# Patient Record
Sex: Male | Born: 2000
Health system: Southern US, Community
[De-identification: ages and names within clinical notes are randomized; demographics above are authoritative.]

## PROBLEM LIST (undated history)

## (undated) DIAGNOSIS — I10 Essential (primary) hypertension: Secondary | ICD-10-CM

## (undated) DIAGNOSIS — G35D Multiple sclerosis, unspecified: Secondary | ICD-10-CM

## (undated) DIAGNOSIS — G35 Multiple sclerosis: Secondary | ICD-10-CM

## (undated) DIAGNOSIS — L309 Dermatitis, unspecified: Secondary | ICD-10-CM

## (undated) HISTORY — PX: NO PAST SURGERIES: SHX2092

## (undated) HISTORY — DX: Multiple sclerosis: G35

## (undated) HISTORY — DX: Essential (primary) hypertension: I10

## (undated) HISTORY — DX: Multiple sclerosis, unspecified: G35.D

## (undated) HISTORY — DX: Dermatitis, unspecified: L30.9

---
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
## 2020-01-13 ENCOUNTER — Ambulatory Visit: Payer: BC Managed Care – PPO | Admitting: Family Medicine

## 2020-01-25 ENCOUNTER — Ambulatory Visit: Payer: BC Managed Care – PPO | Attending: Internal Medicine

## 2020-01-25 DIAGNOSIS — Z23 Encounter for immunization: Secondary | ICD-10-CM

## 2020-01-25 NOTE — Progress Notes (Signed)
   [id number]  Name:  Javonne Dorko.    MRN: [id number] DOB: 2001/04/14  01/25/2020  Mr. Ryant was observed post Covid-19 immunization for 15 minutes without incident. He was provided with Vaccine Information Sheet and instruction to access the V-Safe system.   Mr. Mikita was instructed to call 911 with any severe reactions post vaccine: [name] Difficulty breathing  . Swelling of face and throat  . A fast heartbeat  . A bad rash all over body  . Dizziness and weakness   Immunizations Administered    Name Date Dose VIS Date Route   Pfizer COVID-19 Vaccine 01/25/2020  8:12 AM 0.3 mL 09/25/2019 Intramuscular   Manufacturer: [employer], [employer]   Lot: [id number]   NDC: [address]-9

## 2020-03-24 ENCOUNTER — Ambulatory Visit: Payer: BC Managed Care – PPO | Admitting: Family Medicine

## 2020-04-12 ENCOUNTER — Ambulatory Visit (INDEPENDENT_AMBULATORY_CARE_PROVIDER_SITE_OTHER): Payer: BC Managed Care – PPO | Admitting: Family Medicine

## 2020-04-12 ENCOUNTER — Encounter: Payer: Self-pay | Admitting: Family Medicine

## 2020-04-12 ENCOUNTER — Other Ambulatory Visit: Payer: Self-pay

## 2020-04-12 VITALS — BP 110/72 | HR 68 | Temp 98.0°F | Ht 66.0 in | Wt 197.2 lb

## 2020-04-12 DIAGNOSIS — I10 Essential (primary) hypertension: Secondary | ICD-10-CM | POA: Diagnosis not present

## 2020-04-12 DIAGNOSIS — J309 Allergic rhinitis, unspecified: Secondary | ICD-10-CM

## 2020-04-12 NOTE — Patient Instructions (Signed)
It was very nice to see you today!  Keep up the good work!  No changes today.  Please only know when you need a refill on your lisinopril.  Come back in 1 year for your annual checkup with blood work.  Please come back to see me sooner if needed.  Take care, Dr Jimmey Ralph  Please try these tips to maintain a healthy lifestyle:   Eat at least 3 REAL meals and 1-2 snacks per day.  Aim for no more than 5 hours between eating.  If you eat breakfast, please do so within one hour of getting up.    Each meal should contain half fruits/vegetables, one quarter protein, and one quarter carbs (no bigger than a computer mouse)   Cut down on sweet beverages. This includes juice, soda, and sweet tea.     Drink at least 1 glass of water with each meal and aim for at least 8 glasses per day   Exercise at least 150 minutes every week.

## 2020-04-12 NOTE — Assessment & Plan Note (Signed)
Stable.  Continue Zyrtec 10 mg daily and Flonase daily.

## 2020-04-12 NOTE — Progress Notes (Signed)
   [address] Sean Fox. is a 19 y.o. male who presents today for an office visit.  He is a new patient establishing care today.  Assessment/Plan:  Chronic Problems Addressed Today: Allergic rhinitis Stable.  Continue Zyrtec 10 mg daily and Flonase daily.  Essential hypertension Stable and at goal. Continue lisinopril 5 mg daily.  Check CBC, C met, TSH next blood draw.     Subjective:  HPI:  His stable, chronic medical conditions are outlined below:  #Essential hypertension - On lisinopril 5 mg daily.  Tolerating well - ROS: No reported chest Fox or shortness of breath  #Allergic Rhinitis - On Zyrtec 10 mg daily and Flonase daily.  Tolerating both well        Objective:  Physical Exam: BP 110/72   Pulse 68   Temp 98 F (36.7 C)   Ht '5\' 6"'$  (1.676 m)   Wt 197 lb 3.2 oz (89.4 kg)   SpO2 98%   BMI 31.83 kg/m   Gen: No acute distress, resting comfortably CV: Regular rate and rhythm with no murmurs appreciated Pulm: Normal work of breathing, clear to auscultation bilaterally with no crackles, wheezes, or rhonchi Neuro: Grossly normal, moves all extremities Psych: Normal affect and thought content      Sean Makara M. Jerline Pain, MD 04/12/2020 3:12 PM

## 2020-04-12 NOTE — Assessment & Plan Note (Signed)
Stable and at goal. Continue lisinopril 5 mg daily.  Check CBC, C met, TSH next blood draw.

## 2020-09-15 ENCOUNTER — Encounter: Payer: Self-pay | Admitting: Family Medicine

## 2020-09-15 ENCOUNTER — Other Ambulatory Visit: Payer: Self-pay

## 2020-09-15 MED ORDER — LISINOPRIL 5 MG PO TABS: 5.0000 mg | ORAL_TABLET | Freq: Every day | ORAL | 1 refills | Status: DC

## 2020-11-22 ENCOUNTER — Emergency Department (HOSPITAL_COMMUNITY)
Admission: EM | Admit: 2020-11-22 | Discharge: 2020-11-22 | Disposition: A | Discharge status: Home / Self Care | Payer: BC Managed Care – PPO | Source: Home / Self Care | Attending: Emergency Medicine | Admitting: Emergency Medicine

## 2020-11-22 ENCOUNTER — Encounter (HOSPITAL_COMMUNITY): Payer: Self-pay

## 2020-11-22 ENCOUNTER — Other Ambulatory Visit: Payer: Self-pay

## 2020-11-22 DIAGNOSIS — Z79899 Other long term (current) drug therapy: Secondary | ICD-10-CM | POA: Insufficient documentation

## 2020-11-22 DIAGNOSIS — I10 Essential (primary) hypertension: Secondary | ICD-10-CM | POA: Insufficient documentation

## 2020-11-22 DIAGNOSIS — E559 Vitamin D deficiency, unspecified: Secondary | ICD-10-CM | POA: Diagnosis not present

## 2020-11-22 DIAGNOSIS — H9312 Tinnitus, left ear: Secondary | ICD-10-CM | POA: Insufficient documentation

## 2020-11-22 DIAGNOSIS — G35 Multiple sclerosis: Secondary | ICD-10-CM | POA: Diagnosis not present

## 2020-11-22 DIAGNOSIS — M5021 Other cervical disc displacement,  high cervical region: Secondary | ICD-10-CM | POA: Diagnosis not present

## 2020-11-22 DIAGNOSIS — J3489 Other specified disorders of nose and nasal sinuses: Secondary | ICD-10-CM | POA: Insufficient documentation

## 2020-11-22 DIAGNOSIS — R0981 Nasal congestion: Secondary | ICD-10-CM | POA: Insufficient documentation

## 2020-11-22 DIAGNOSIS — R202 Paresthesia of skin: Secondary | ICD-10-CM | POA: Insufficient documentation

## 2020-11-22 DIAGNOSIS — R42 Dizziness and giddiness: Secondary | ICD-10-CM | POA: Insufficient documentation

## 2020-11-22 DIAGNOSIS — R432 Parageusia: Secondary | ICD-10-CM | POA: Diagnosis not present

## 2020-11-22 DIAGNOSIS — Z8249 Family history of ischemic heart disease and other diseases of the circulatory system: Secondary | ICD-10-CM | POA: Diagnosis not present

## 2020-11-22 DIAGNOSIS — M50222 Other cervical disc displacement at C5-C6 level: Secondary | ICD-10-CM | POA: Diagnosis not present

## 2020-11-22 DIAGNOSIS — H5509 Other forms of nystagmus: Secondary | ICD-10-CM | POA: Diagnosis not present

## 2020-11-22 DIAGNOSIS — Q7649 Other congenital malformations of spine, not associated with scoliosis: Secondary | ICD-10-CM | POA: Diagnosis not present

## 2020-11-22 DIAGNOSIS — Z20822 Contact with and (suspected) exposure to covid-19: Secondary | ICD-10-CM | POA: Diagnosis not present

## 2020-11-22 DIAGNOSIS — H9203 Otalgia, bilateral: Secondary | ICD-10-CM | POA: Insufficient documentation

## 2020-11-22 DIAGNOSIS — G379 Demyelinating disease of central nervous system, unspecified: Secondary | ICD-10-CM | POA: Diagnosis not present

## 2020-11-22 DIAGNOSIS — J302 Other seasonal allergic rhinitis: Secondary | ICD-10-CM | POA: Diagnosis not present

## 2020-11-22 DIAGNOSIS — Z820 Family history of epilepsy and other diseases of the nervous system: Secondary | ICD-10-CM | POA: Diagnosis not present

## 2020-11-22 LAB — COMPREHENSIVE METABOLIC PANEL
ALT: 19 U/L (ref 0–44)
AST: 17 U/L (ref 15–41)
Albumin: 4.8 g/dL (ref 3.5–5.0)
Alkaline Phosphatase: 60 U/L (ref 38–126)
Anion gap: 8 (ref 5–15)
BUN: 14 mg/dL (ref 6–20)
CO2: 27 mmol/L (ref 22–32)
Calcium: 9.7 mg/dL (ref 8.9–10.3)
Chloride: 105 mmol/L (ref 98–111)
Creatinine, Ser: 0.78 mg/dL (ref 0.61–1.24)
GFR, Estimated: >60 mL/min (ref >60)
Glucose, Bld: 99 mg/dL (ref 70–99)
Potassium: 4.4 mmol/L (ref 3.5–5.1)
Sodium: 140 mmol/L (ref 135–145)
Total Bilirubin: 0.6 mg/dL (ref 0.3–1.2)
Total Protein: 8.1 g/dL (ref 6.5–8.1)

## 2020-11-22 LAB — CBC WITH DIFFERENTIAL/PLATELET
Abs Immature Granulocytes: 0.02 10*3/uL (ref 0.00–0.07)
Basophils Absolute: 0 10*3/uL (ref 0.0–0.1)
Basophils Relative: 0 %
Eosinophils Absolute: 0 10*3/uL (ref 0.0–0.5)
Eosinophils Relative: 0 %
HCT: 45.9 % (ref 39.0–52.0)
Hemoglobin: 15.4 g/dL (ref 13.0–17.0)
Immature Granulocytes: 0 %
Lymphocytes Relative: 18 %
Lymphs Abs: 1.1 10*3/uL (ref 0.7–4.0)
MCH: 31.7 pg (ref 26.0–34.0)
MCHC: 33.6 g/dL (ref 30.0–36.0)
MCV: 94.4 fL (ref 80.0–100.0)
Monocytes Absolute: 0.3 10*3/uL (ref 0.1–1.0)
Monocytes Relative: 5 %
Neutro Abs: 4.7 10*3/uL (ref 1.7–7.7)
Neutrophils Relative %: 77 %
Platelets: 263 10*3/uL (ref 150–400)
RBC: 4.86 MIL/uL (ref 4.22–5.81)
RDW: 12.3 % (ref 11.5–15.5)
WBC: 6.2 10*3/uL (ref 4.0–10.5)
nRBC: 0 % (ref 0.0–0.2)

## 2020-11-22 MED ORDER — MECLIZINE HCL 25 MG PO TABS
25.0000 mg | ORAL_TABLET | Freq: Once | ORAL | Status: AC
  Administered 2020-11-22: 25 mg via ORAL
  Filled 2020-11-22: qty 1

## 2020-11-22 MED ORDER — LORAZEPAM 2 MG/ML IJ SOLN
0.5000 mg | Freq: Once | INTRAMUSCULAR | Status: AC
  Administered 2020-11-22: 0.5 mg via INTRAVENOUS
  Filled 2020-11-22: qty 1

## 2020-11-22 MED ORDER — MECLIZINE HCL 25 MG PO TABS: 25.0000 mg | ORAL_TABLET | Freq: Three times a day (TID) | ORAL | 0 refills | Status: DC | PRN

## 2020-11-22 MED ORDER — SODIUM CHLORIDE 0.9 % IV BOLUS
1000.0000 mL | Freq: Once | INTRAVENOUS | Status: AC
  Administered 2020-11-22: 1000 mL via INTRAVENOUS

## 2020-11-22 NOTE — Discharge Instructions (Signed)
Follow the instructions regarding vertigo. Take the meclizine as needed to help with your symptoms. Is important for you to follow-up with the ENT provider listed below for further evaluation and management of your symptoms. Return to the ER if you start to experience headache, blurry vision, numbness in arms or legs, facial droop or shortness of breath.

## 2020-11-22 NOTE — ED Notes (Addendum)
Pt ambulated in hallway with stand by assist. Pt unsteady, pt tends to side step and unable to get balance. Pt stated he "felt dizzy" as he walked back to bedside. Pt returned to bed and vitals were obtained. Vitals in chart.

## 2020-11-22 NOTE — ED Provider Notes (Signed)
I provided a substantive portion of the care of this patient.  I personally performed the entirety of the medical decision making for this encounter.     20 year old male presents with dizziness and left-sided ear tinnitus.  On exam here patient able to ambulate.  No focal deficits.  No gait ataxia.  Given meclizine with slight improvement.  Will redose here.  Low suspicion for intracranial process.  Will likely discharge with ENT referral    Lorre Nick, MD 11/22/20 1406

## 2020-11-22 NOTE — ED Triage Notes (Signed)
Patient reports dizziness and tingling of the left side of his face and ringing in the left ear x 3 days.

## 2020-11-22 NOTE — ED Notes (Signed)
Pt in bed watching tv and talking with visitor. Respirations even and unlabored. Denies any needs at this time.

## 2020-11-22 NOTE — ED Provider Notes (Addendum)
[facility]-EMERGENCY DEPT Provider Note   CSN: [id number] Arrival date & time: 11/22/20  0909     History Chief Complaint  Patient presents with  . Dizziness    Sean Fox. is a 20 y.o. male with a past medical history of hypertension, allergic rhinitis presenting to the ED with a chief complaint of dizziness and tinnitus. For the past 3 days has been having dizziness that he describes as the room spinning around him when he looks at one spot or when he ambulates. Reports paresthesias to the left side of his face as well as ringing in his left ear. States that when he is sitting in silence, there is no tinnitus but as soon as there is any sound omitted from anything, he will experience the tinnitus in his left ear. States that the dizziness got worse today. Reports congestion and rhinorrhea that is not unusual for him around this time of the year due to seasonal allergies. Has been compliant with his medications including his antihistamine and antihypertensive. Denies any vision changes, headache, vomiting, nausea, numbness in arms or legs, facial asymmetry or shortness of breath.  HPI     Past Medical History:  Diagnosis Date  . Eczema   . Hypertension     Patient Active Problem List   Diagnosis Date Noted  . Essential hypertension 04/12/2020  . Allergic rhinitis 04/12/2020  . HyperCKemia 01/02/2019    Past Surgical History:  Procedure Laterality Date  . NO PAST SURGERIES         Family History  Problem Relation Age of Onset  . Hypertension Mother   . Hypertension Father   . Migraines Neg Hx   . Seizures Neg Hx   . Autism Neg Hx   . ADD / ADHD Neg Hx   . Anxiety disorder Neg Hx   . Depression Neg Hx   . Bipolar disorder Neg Hx   . Schizophrenia Neg Hx     Social History   Tobacco Use  . Smoking status: Never Smoker  . Smokeless tobacco: Never Used  Vaping Use  . Vaping Use: Never used  Substance Use Topics  . Alcohol use: Never  .  Drug use: Never    Home Medications Prior to Admission medications   Medication Sig Start Date End Date Taking? Authorizing Provider  acetaminophen (TYLENOL) 325 MG tablet Take 650 mg by mouth every 6 (six) hours as needed for mild pain, fever or headache.   Yes [provider]  cetirizine (ZYRTEC) 10 MG tablet Take 10 mg by mouth daily. 01/24/17  Yes [provider]  Cholecalciferol 25 MCG (1000 UT) tablet Take 2,000 Units by mouth daily.   Yes [provider]  lisinopril (ZESTRIL) 5 MG tablet Take 1 tablet (5 mg total) by mouth daily. 09/15/20  Yes Ardith Dark, MD  meclizine (ANTIVERT) 25 MG tablet Take 1 tablet (25 mg total) by mouth 3 (three) times daily as needed for dizziness. 11/22/20  Yes Tameshia Bonneville, PA-C  Multiple Vitamin (MULTIVITAMIN WITH MINERALS) TABS tablet Take 1 tablet by mouth daily.   Yes [provider]  SUPER B COMPLEX/C PO Take 1 tablet by mouth daily.   Yes [provider]    Allergies    Gramineae pollens and Mold extract [trichophyton]  Review of Systems   Review of Systems  Constitutional: Negative for appetite change, chills and fever.  HENT: Positive for tinnitus. Negative for ear pain, rhinorrhea, sneezing and sore throat.  Eyes: Negative for photophobia and visual disturbance.  Respiratory: Negative for cough, chest tightness, shortness of breath and wheezing.   Cardiovascular: Negative for chest pain and palpitations.  Gastrointestinal: Negative for abdominal pain, blood in stool, constipation, diarrhea, nausea and vomiting.  Genitourinary: Negative for dysuria, hematuria and urgency.  Musculoskeletal: Negative for myalgias.  Skin: Negative for rash.  Neurological: Positive for dizziness. Negative for tremors, syncope, facial asymmetry, speech difficulty, weakness, light-headedness, numbness and headaches.    Physical Exam Updated Vital Signs BP (!) 141/69   Pulse 75   Temp 98.2 F (36.8 C) (Oral)    Resp 18   Ht 5\' 6"  (1.676 m)   Wt 75.8 kg   SpO2 100%   BMI 26.95 kg/m   Physical Exam Vitals and nursing note reviewed.  Constitutional:      General: He is not in acute distress.    Appearance: He is well-developed and well-nourished.  HENT:     Head: Normocephalic and atraumatic.     Right Ear: A middle ear effusion is present. Tympanic membrane is not perforated or erythematous.     Left Ear: A middle ear effusion is present. Tympanic membrane is not perforated or erythematous.     Nose: Nose normal.  Eyes:     General: No scleral icterus.       Right eye: No discharge.        Left eye: No discharge.     Extraocular Movements: EOM normal.     Conjunctiva/sclera: Conjunctivae normal.     Pupils: Pupils are equal, round, and reactive to light.  Cardiovascular:     Rate and Rhythm: Normal rate and regular rhythm.     Pulses: Intact distal pulses.     Heart sounds: Normal heart sounds. No murmur heard. No friction rub. No gallop.   Pulmonary:     Effort: Pulmonary effort is normal. No respiratory distress.     Breath sounds: Normal breath sounds.  Abdominal:     General: Bowel sounds are normal. There is no distension.     Palpations: Abdomen is soft.     Tenderness: There is no abdominal tenderness. There is no guarding.  Musculoskeletal:        General: No edema. Normal range of motion.     Cervical back: Normal range of motion and neck supple.  Skin:    General: Skin is warm and dry.     Findings: No rash.  Neurological:     Mental Status: He is alert and oriented to person, place, and time.     Cranial Nerves: No cranial nerve deficit.     Sensory: No sensory deficit.     Motor: No weakness or abnormal muscle tone.     Coordination: Coordination normal.     Comments: Pupils reactive. No facial asymmetry noted. Cranial nerves appear grossly intact. Sensation intact to light touch on face, BUE and BLE. Strength 5/5 in BUE and BLE. Normal finger-to-nose coordination  bilaterally. Horizontal nystagmus noted bilaterally.  Psychiatric:        Mood and Affect: Mood and affect normal.     ED Results / Procedures / Treatments   Labs (all labs ordered are listed, but only abnormal results are displayed) Labs Reviewed  COMPREHENSIVE METABOLIC PANEL  CBC WITH DIFFERENTIAL/PLATELET    EKG EKG Interpretation  Date/Time:  Tuesday November 22 2020 14:01:45 EST Ventricular Rate:  72 PR Interval:    QRS Duration: 95 QT Interval:  397 QTC Calculation: 435 R  Axis:   93 Text Interpretation: Sinus arrhythmia Borderline right axis deviation No old tracing to compare Confirmed by Lorre Nick ([id number]) on 11/22/2020 2:56:49 PM   Radiology No results found.  Procedures Procedures   Medications Ordered in ED Medications  sodium chloride 0.9 % bolus 1,000 mL (0 mLs Intravenous Stopped 11/22/20 1427)  meclizine (ANTIVERT) tablet 25 mg (25 mg Oral Given 11/22/20 1202)  meclizine (ANTIVERT) tablet 25 mg (25 mg Oral Given 11/22/20 1425)  LORazepam (ATIVAN) injection 0.5 mg (0.5 mg Intravenous Given 11/22/20 1425)    ED Course  I have reviewed the triage vital signs and the nursing notes.  Pertinent labs & imaging results that were available during my care of the patient were reviewed by me and considered in my medical decision making (see chart for details).  Clinical Course as of 11/22/20 1500  Tue Nov 22, 2020  1143 Sodium: 140 [HK]  1143 Potassium: 4.4 [HK]  1351 Patient continues to have dizziness and feeling off balance despite IV fluids and meclizine.  Will add on additional dose of meclizine as well as Ativan and obtain EKG. [HK]    Clinical Course User Index [HK] Dietrich Pates, PA-C   MDM Rules/Calculators/A&P                          [phone] M with a past medical history of HTN, allergic rhinitis, presenting to the ED with a chief complaint of dizziness and tinnitus.  3-day history of dizziness and feeling like the room is spinning around him.  Reports  intermittent paresthesias to the left side of his face as well as ringing in his left ear when there is any type of sound there. This improves with silence.  He is unable to identify any trigger 3 days ago that may have caused this.  No headache, vision changes, numbness in arms or legs, facial droop, fever or recent illness.  On exam patient without any numbness or weakness of upper and lower extremities.  Sensation intact on face but he reports a "different feeling" with light touch to the left side of his face.  No facial asymmetry. He has bilateral horizontal nystagmus noted.  Normal coordination.  Lab work here including CBC, CMP are unremarkable.  Patient given IV fluids and 25 mg of meclizine here.  Unfortunately continues to be symptomatic with ambulation, but does report some improvement. However he is not ataxic. Question BPPV vs other dysfunction of the inner ear. Doubt emergent neurological cause such as stroke or dissection based on his age, comorbidities and physical exam findings will reassess after additional meclizine and Ativan.  2:58 PM Patient with continued improvement after additional medications.  He remains able to ambulate.  I feel that he will benefit from ENT follow-up for vertigo.  Patient mother comfortable with discharge home with meclizine as needed.  Will give instructions for exercises at home.  Return precautions given. Patient discussed with and seen by the attending, Dr. Freida Busman.  Patient is hemodynamically stable, in NAD, and able to ambulate in the ED. Evaluation does not show pathology that would require ongoing emergent intervention or inpatient treatment. I explained the diagnosis to the patient. Pain has been managed and has no complaints prior to discharge. Patient is comfortable with above plan and is stable for discharge at this time. All questions were answered prior to disposition. Strict return precautions for returning to the ED were discussed. Encouraged follow  up with PCP.  An After Visit Summary was printed and given to the patient.   Portions of this note were generated with Scientist, clinical (histocompatibility and immunogenetics). Dictation errors may occur despite best attempts at proofreading.  Final Clinical Impression(s) / ED Diagnoses Final diagnoses:  Vertigo    Rx / DC Orders ED Discharge Orders         Ordered    meclizine (ANTIVERT) 25 MG tablet  3 times daily PRN        11/22/20 1500            Dietrich Pates, PA-C 11/22/20 1502    Lorre Nick, MD 11/23/20 1342

## 2020-11-23 ENCOUNTER — Emergency Department (HOSPITAL_COMMUNITY): Payer: BC Managed Care – PPO

## 2020-11-23 ENCOUNTER — Encounter: Payer: Self-pay | Admitting: Family Medicine

## 2020-11-23 ENCOUNTER — Ambulatory Visit: Payer: BC Managed Care – PPO | Admitting: Family Medicine

## 2020-11-23 ENCOUNTER — Inpatient Hospital Stay (HOSPITAL_COMMUNITY)
Admission: RE | Admit: 2020-11-23 | Discharge: 2020-11-23 | Disposition: A | Discharge status: Home / Self Care | Payer: BC Managed Care – PPO | Source: Ambulatory Visit | Attending: Family Medicine | Admitting: Family Medicine

## 2020-11-23 ENCOUNTER — Other Ambulatory Visit: Payer: Self-pay

## 2020-11-23 ENCOUNTER — Encounter (HOSPITAL_COMMUNITY): Payer: Self-pay

## 2020-11-23 ENCOUNTER — Inpatient Hospital Stay (HOSPITAL_COMMUNITY)
Admission: EM | Admit: 2020-11-23 | Discharge: 2020-11-27 | DRG: 060 | Disposition: A | Discharge status: Home / Self Care | Payer: BC Managed Care – PPO | Source: Ambulatory Visit | Attending: Internal Medicine | Admitting: Internal Medicine

## 2020-11-23 VITALS — BP 127/76 | HR 81 | Temp 97.6°F | Ht 66.0 in | Wt 174.4 lb

## 2020-11-23 DIAGNOSIS — R42 Dizziness and giddiness: Secondary | ICD-10-CM

## 2020-11-23 DIAGNOSIS — G35A Relapsing-remitting multiple sclerosis: Secondary | ICD-10-CM | POA: Diagnosis present

## 2020-11-23 DIAGNOSIS — J302 Other seasonal allergic rhinitis: Secondary | ICD-10-CM | POA: Diagnosis present

## 2020-11-23 DIAGNOSIS — I10 Essential (primary) hypertension: Secondary | ICD-10-CM | POA: Diagnosis present

## 2020-11-23 DIAGNOSIS — Z79899 Other long term (current) drug therapy: Secondary | ICD-10-CM | POA: Diagnosis not present

## 2020-11-23 DIAGNOSIS — R432 Parageusia: Secondary | ICD-10-CM

## 2020-11-23 DIAGNOSIS — Z820 Family history of epilepsy and other diseases of the nervous system: Secondary | ICD-10-CM

## 2020-11-23 DIAGNOSIS — H5509 Other forms of nystagmus: Secondary | ICD-10-CM

## 2020-11-23 DIAGNOSIS — E559 Vitamin D deficiency, unspecified: Secondary | ICD-10-CM | POA: Diagnosis present

## 2020-11-23 DIAGNOSIS — Z20822 Contact with and (suspected) exposure to covid-19: Secondary | ICD-10-CM | POA: Diagnosis present

## 2020-11-23 DIAGNOSIS — R202 Paresthesia of skin: Secondary | ICD-10-CM | POA: Insufficient documentation

## 2020-11-23 DIAGNOSIS — G35D Multiple sclerosis, unspecified: Secondary | ICD-10-CM | POA: Diagnosis present

## 2020-11-23 DIAGNOSIS — G35 Multiple sclerosis: Secondary | ICD-10-CM | POA: Diagnosis present

## 2020-11-23 DIAGNOSIS — Z8249 Family history of ischemic heart disease and other diseases of the circulatory system: Secondary | ICD-10-CM | POA: Diagnosis not present

## 2020-11-23 LAB — RESP PANEL BY RT-PCR (FLU A&B, COVID) ARPGX2
Influenza A by PCR: NEGATIVE
Influenza B by PCR: NEGATIVE
SARS Coronavirus 2 by RT PCR: NEGATIVE

## 2020-11-23 MED ORDER — ACETAMINOPHEN 325 MG PO TABS: 650.0000 mg | ORAL_TABLET | Freq: Four times a day (QID) | ORAL | Status: DC | PRN

## 2020-11-23 MED ORDER — ONDANSETRON HCL 4 MG PO TABS: 4.0000 mg | ORAL_TABLET | Freq: Four times a day (QID) | ORAL | Status: DC | PRN

## 2020-11-23 MED ORDER — SODIUM CHLORIDE 0.9 % IV SOLN
1000.0000 mg | Freq: Every day | INTRAVENOUS | Status: AC
  Administered 2020-11-24 – 2020-11-27 (×5): 1000 mg via INTRAVENOUS
  Filled 2020-11-23 (×5): qty 8

## 2020-11-23 MED ORDER — GADOBUTROL 1 MMOL/ML IV SOLN
8.0000 mL | Freq: Once | INTRAVENOUS | Status: AC | PRN
  Administered 2020-11-23: 8 mL via INTRAVENOUS

## 2020-11-23 MED ORDER — ONDANSETRON HCL 4 MG/2ML IJ SOLN: 4.0000 mg | Freq: Four times a day (QID) | INTRAMUSCULAR | Status: DC | PRN

## 2020-11-23 MED ORDER — LISINOPRIL 10 MG PO TABS
5.0000 mg | ORAL_TABLET | Freq: Every day | ORAL | Status: DC
  Administered 2020-11-24 – 2020-11-25 (×2): 5 mg via ORAL
  Filled 2020-11-23 (×2): qty 1

## 2020-11-23 MED ORDER — ONDANSETRON 8 MG PO TBDP: 8.0000 mg | ORAL_TABLET | Freq: Three times a day (TID) | ORAL | 0 refills | Status: DC | PRN

## 2020-11-23 MED ORDER — LACTATED RINGERS IV SOLN: INTRAVENOUS | Status: DC

## 2020-11-23 MED ORDER — MECLIZINE HCL 12.5 MG PO TABS
25.0000 mg | ORAL_TABLET | Freq: Three times a day (TID) | ORAL | Status: DC | PRN
  Administered 2020-11-25 – 2020-11-26 (×2): 25 mg via ORAL
  Filled 2020-11-23 (×2): qty 2

## 2020-11-23 NOTE — ED Provider Notes (Signed)
[facility] EMERGENCY DEPARTMENT Provider Note  CSN: [id number] Arrival date & time: 11/23/20 1459    History Chief Complaint  Patient presents with  . Dizziness    HPI  Sean Fox. is a 20 y.o. male with history of HTN has had several days of room spinning dizziness, L face paresthesias and ringing in L ear. No numbness or weakness in extremities. Seen at [facility] for same yesterday and felt to have peripheral vertigo, discharged with Rx for Meclizine. CBC and CMP were normal. He went to his PCP today in follow up and was noted to have continued horizontal nystagmus so sent for an outpatient MRI which showed lesions concerning for MS. Mother at bedside reports strong family history of MS as well.    Past Medical History:  Diagnosis Date  . Eczema   . Hypertension     Past Surgical History:  Procedure Laterality Date  . NO PAST SURGERIES      Family History  Problem Relation Age of Onset  . Hypertension Mother   . Hypertension Father   . Migraines Neg Hx   . Seizures Neg Hx   . Autism Neg Hx   . ADD / ADHD Neg Hx   . Anxiety disorder Neg Hx   . Depression Neg Hx   . Bipolar disorder Neg Hx   . Schizophrenia Neg Hx     Social History   Tobacco Use  . Smoking status: Never Smoker  . Smokeless tobacco: Never Used  Vaping Use  . Vaping Use: Never used  Substance Use Topics  . Alcohol use: Never  . Drug use: Never     Home Medications Prior to Admission medications   Medication Sig Start Date End Date Taking? Authorizing Provider  acetaminophen (TYLENOL) 325 MG tablet Take 650 mg by mouth every 6 (six) hours as needed for mild pain, fever or headache.    [provider]  cetirizine (ZYRTEC) 10 MG tablet Take 10 mg by mouth daily. 01/24/17   [provider]  Cholecalciferol 25 MCG (1000 UT) tablet Take 2,000 Units by mouth daily.    [provider]  lisinopril (ZESTRIL) 5 MG tablet Take 1 tablet (5 mg total) by mouth daily. 09/15/20    Ardith Dark, MD  meclizine (ANTIVERT) 25 MG tablet Take 1 tablet (25 mg total) by mouth 3 (three) times daily as needed for dizziness. 11/22/20   Khatri, Hina, PA-C  Multiple Vitamin (MULTIVITAMIN WITH MINERALS) TABS tablet Take 1 tablet by mouth daily.    [provider]  ondansetron (ZOFRAN ODT) 8 MG disintegrating tablet Take 1 tablet (8 mg total) by mouth every 8 (eight) hours as needed for nausea or vomiting. 11/23/20   Ardith Dark, MD  SUPER B COMPLEX/C PO Take 1 tablet by mouth daily.    [provider]     Allergies    Gramineae pollens and Mold extract [trichophyton]   Review of Systems   Review of Systems A comprehensive review of systems was completed and negative except as noted in HPI.    Physical Exam BP (!) 151/77   Pulse 72   Temp 99.2 F (37.3 C)   Resp 18   SpO2 98%   Physical Exam Vitals and nursing note reviewed.  Constitutional:      Appearance: Normal appearance.  HENT:     Head: Normocephalic and atraumatic.     Nose: Nose normal.     Mouth/Throat:     Mouth:  Mucous membranes are moist.  Eyes:     Extraocular Movements: Extraocular movements intact.     Conjunctiva/sclera: Conjunctivae normal.  Cardiovascular:     Rate and Rhythm: Normal rate.  Pulmonary:     Effort: Pulmonary effort is normal.     Breath sounds: Normal breath sounds.  Abdominal:     General: Abdomen is flat.     Palpations: Abdomen is soft.     Tenderness: There is no abdominal tenderness.  Musculoskeletal:        General: No swelling. Normal range of motion.     Cervical back: Neck supple.  Skin:    General: Skin is warm and dry.  Neurological:     General: No focal deficit present.     Mental Status: He is alert.     Comments: Subjective change in sensation of L face, horizontal nystagmus, normal strength and sensation in extremities.   Psychiatric:        Mood and Affect: Mood normal.      ED Results / Procedures / Treatments   Labs (all  labs ordered are listed, but only abnormal results are displayed) Labs Reviewed - No data to display  EKG None  Radiology MR Brain Wo Contrast  Result Date: 11/23/2020 CLINICAL DATA:  Horizontal nystagmus, vertigo, paresthesia, dysgeusia. EXAM: MRI HEAD WITHOUT CONTRAST TECHNIQUE: Multiplanar, multiecho pulse sequences of the brain and surrounding structures were obtained without intravenous contrast. COMPARISON:  None. FINDINGS: Brain: No acute infarction, hemorrhage, hydrocephalus, extra-axial collection or mass lesion. Scattered foci of T2 hyperintensity are seen within the white matter of the cerebral hemispheres, including deep, juxta cortical, periventricular white matter and corpus callosum, as well as left cerebral peduncle, left side of the pons and bilateral middle cerebellar peduncles and cerebellar hemispheres. Vascular: Normal flow voids. Skull and upper cervical spine: Normal marrow signal. Sinuses/Orbits: Negative. IMPRESSION: Scattered foci of T2 hyperintensity within the white matter of the cerebral hemispheres, including deep, juxta cortical, periventricular white matter and corpus callosum, as well as left cerebral peduncle, left side of the pons and bilateral middle cerebellar peduncles and cerebellar hemispheres. While nonspecific, findings are highly concerning for demyelinating disease. Electronically Signed   By: Baldemar Lenis M.D.   On: 11/23/2020 13:15    Procedures Procedures  Medications Ordered in the ED Medications - No data to display   MDM Rules/Calculators/A&P MDM MRI images reviewed, will discuss with Neurology.   ED Course  I have reviewed the triage vital signs and the nursing notes.  Pertinent labs & imaging results that were available during my care of the patient were reviewed by me and considered in my medical decision making (see chart for details).  Clinical Course as of 11/23/20 2140  Wed Nov 23, 2020  1916 Spoke with Dr. Wilford Corner,  Neurology who has reviewed the patient's outpatient MRI and requests he have another MRI with contrast this time to evaluate whether there are active lesions. Patient and mother aware of plan.  [CS]  2129 Reviewed MRI images with Dr. Wilford Corner, who recommends the patient be admitted for high dose steroids which he will order. Hospitalist paged to admit.  [CS]  2139 Spoke with Dr. Rachael Darby, Hospitalist, who will evaluate for admission.  [CS]    Clinical Course User Index [CS] Pollyann Savoy, MD    Final Clinical Impression(s) / ED Diagnoses Final diagnoses:  None    Rx / DC Orders ED Discharge Orders    None  Pollyann Savoy, MD 11/23/20 Windell Moment

## 2020-11-23 NOTE — Patient Instructions (Signed)
It was very nice to see you today!  You may be having an inner ear issue.  We need to get a scan of your brain to make sure there is nothing else going on.  Please take the Zofran as needed to help with your nausea.  Please try to stay as well hydrated as possible.  We will contact you with the results when your scan once it is back.   Take care, Dr Jimmey Ralph  Please try these tips to maintain a healthy lifestyle:   Eat at least 3 REAL meals and 1-2 snacks per day.  Aim for no more than 5 hours between eating.  If you eat breakfast, please do so within one hour of getting up.    Each meal should contain half fruits/vegetables, one quarter protein, and one quarter carbs (no bigger than a computer mouse)   Cut down on sweet beverages. This includes juice, soda, and sweet tea.     Drink at least 1 glass of water with each meal and aim for at least 8 glasses per day   Exercise at least 150 minutes every week.

## 2020-11-23 NOTE — ED Notes (Signed)
Patient in MRI 

## 2020-11-23 NOTE — H&P (Signed)
History and Physical    [employer] KWI:[id number] DOB: December 01, 2000 DOA: 11/23/2020  PCP: Ardith Dark, MD   Patient coming from: Home  Chief Complaint: Dizziness, intermittent blurry vision.   HPI: Sean Fox. is a 20 y.o. male with medical history significant for HTN has had several days of dizziness which he describes as the room spinning. He also reports left face paresthesias and ringing in left ear. No numbness or weakness in extremities. Seen at [facility] for same symptoms yesterday and felt to have peripheral vertigo and he was discharged with Rx for Meclizine. CBC and CMP were normal. He went to his PCP today in follow up and was noted to have continued horizontal nystagmus so he was sent for an outpatient MRI which showed lesions concerning for MS. He denies any current weakness but says that he feels some tingling and paresthesias on the left face.  When asked about any prior episodes of visual symptoms, he says that he cannot remember exactly when it happened but has had previous visual changes that that lasted a few days to a week before they got better. He does not remember whether it was painful or painless. Mother was at bedside and reported that she has multiple family members including brother, niece and grandparents who had MS and one of the family members-the mother's brother, passed away due to complications of MS at a very young age in his 51s. Does not describe worsening of symptoms with heat.  Does not describe any electric shocklike sensation down the spine.   Review of Systems:  General: Denies fever, chills, weight loss, night sweats. Reports dizziness.  Denies change in appetite HENT: Denies head trauma, headache, denies change in hearing, tinnitus.  Denies nasal congestion or bleeding.  Denies sore throat, sores in mouth.  Denies difficulty swallowing Eyes: Reports intermittent blurry vision. Denies pain in eye, drainage.  Denies discoloration of eyes. Neck: Denies  pain.  Denies swelling.  Denies pain with movement. Cardiovascular: Denies chest pain, palpitations.  Denies edema.  Denies orthopnea Respiratory: Denies shortness of breath, cough.  Denies wheezing.  Denies sputum production Gastrointestinal: Denies abdominal pain, swelling.  Denies nausea, vomiting, diarrhea.  Denies melena.  Denies hematemesis. Musculoskeletal: Denies limitation of movement.  Denies deformity or swelling.  Denies pain.  Denies arthralgias or myalgias. Genitourinary: Denies pelvic pain.  Denies urinary frequency or hesitancy.  Denies dysuria.  Skin: Denies rash.  Denies petechiae, purpura, ecchymosis. Neurological: Denies headache.  Denies syncope.  Denies seizure activity. Denies weakness. Denies slurred speech, drooping face. Reports vertigo Psychiatric: Denies depression, anxiety. Denies hallucinations.  Past Medical History:  Diagnosis Date  . Eczema   . Hypertension     Past Surgical History:  Procedure Laterality Date  . NO PAST SURGERIES      Social History  reports that he has never smoked. He has never used smokeless tobacco. He reports that he does not drink alcohol and does not use drugs.  Allergies  Allergen Reactions  . Gramineae Pollens   . Mold Extract [Trichophyton]     Family History  Problem Relation Age of Onset  . Hypertension Mother   . Hypertension Father   . Migraines Neg Hx   . Seizures Neg Hx   . Autism Neg Hx   . ADD / ADHD Neg Hx   . Anxiety disorder Neg Hx   . Depression Neg Hx   . Bipolar disorder Neg Hx   . Schizophrenia Neg Hx  Prior to Admission medications   Medication Sig Start Date End Date Taking? Authorizing Provider  acetaminophen (TYLENOL) 325 MG tablet Take 650 mg by mouth every 6 (six) hours as needed for mild pain, fever or headache.    [provider]  cetirizine (ZYRTEC) 10 MG tablet Take 10 mg by mouth daily. 01/24/17   [provider]  Cholecalciferol 25 MCG (1000 UT) tablet Take  2,000 Units by mouth daily.    [provider]  lisinopril (ZESTRIL) 5 MG tablet Take 1 tablet (5 mg total) by mouth daily. 09/15/20   Ardith DarkParker, Caleb M, MD  meclizine (ANTIVERT) 25 MG tablet Take 1 tablet (25 mg total) by mouth 3 (three) times daily as needed for dizziness. 11/22/20   Khatri, Hina, PA-C  Multiple Vitamin (MULTIVITAMIN WITH MINERALS) TABS tablet Take 1 tablet by mouth daily.    [provider]  ondansetron (ZOFRAN ODT) 8 MG disintegrating tablet Take 1 tablet (8 mg total) by mouth every 8 (eight) hours as needed for nausea or vomiting. 11/23/20   Ardith DarkParker, Caleb M, MD  SUPER B COMPLEX/C PO Take 1 tablet by mouth daily.    [provider]    Physical Exam: Vitals:   11/23/20 1530 11/23/20 1843 11/23/20 1930 11/23/20 2204  BP: 133/67 (!) 151/77 140/66 124/79  Pulse: 94 72 74 79  Resp: 16 18 14 16   Temp: 98.5 F (36.9 C) 99.2 F (37.3 C) 98.5 F (36.9 C)   TempSrc:   Oral   SpO2: 100% 98% 100% 100%    Constitutional: NAD, calm, comfortable Vitals:   11/23/20 1530 11/23/20 1843 11/23/20 1930 11/23/20 2204  BP: 133/67 (!) 151/77 140/66 124/79  Pulse: 94 72 74 79  Resp: 16 18 14 16   Temp: 98.5 F (36.9 C) 99.2 F (37.3 C) 98.5 F (36.9 C)   TempSrc:   Oral   SpO2: 100% 98% 100% 100%   General: WDWN, Alert and oriented x3.  Eyes: EOMI, PERRL, conjunctivae normal.  Sclera nonicteric. Has nystagmus fast component to the right on left and right gaze HENT:  [place]/AT, external ears normal.  Nares patent without epistasis.  Mucous membranes are moist. Posterior pharynx clear of any exudate or lesions. Normal dentition.  Neck: Soft, normal range of motion, supple, no masses, no thyromegaly. Trachea midline Respiratory: clear to auscultation bilaterally, no wheezing, no crackles. Normal respiratory effort. No accessory muscle use.  Cardiovascular: Regular rate and rhythm, no murmurs / rubs / gallops. No extremity edema. 2+ pedal pulses.  Abdomen: Soft, no  tenderness, nondistended, no rebound or guarding.  No masses palpated. Bowel sounds normoactive Musculoskeletal: FROM. no cyanosis. No joint deformity upper and lower extremities. Normal muscle tone.  Skin: Warm, dry, intact no rashes, lesions, ulcers. No induration Neurologic:  Has diminished sensation on left face and forehead to light touch. Normal speech.  patella DTR +1 bilaterally. Strength 5/5 in all extremities.   Psychiatric: Normal judgment and insight.  Normal mood.    Labs on Admission: I have personally reviewed following labs and imaging studies  CBC: Recent Labs  Lab 11/22/20 1101  WBC 6.2  NEUTROABS 4.7  HGB 15.4  HCT 45.9  MCV 94.4  PLT 263    Basic Metabolic Panel: Recent Labs  Lab 11/22/20 1101  NA 140  K 4.4  CL 105  CO2 27  GLUCOSE 99  BUN 14  CREATININE 0.78  CALCIUM 9.7    GFR: Estimated Creatinine Clearance: 146.8 mL/min (by C-G formula based on  SCr of 0.78 mg/dL).  Liver Function Tests: Recent Labs  Lab 11/22/20 1101  AST 17  ALT 19  ALKPHOS 60  BILITOT 0.6  PROT 8.1  ALBUMIN 4.8    Urine analysis: No results found for: COLORURINE, APPEARANCEUR, LABSPEC, PHURINE, GLUCOSEU, HGBUR, BILIRUBINUR, KETONESUR, PROTEINUR, UROBILINOGEN, NITRITE, LEUKOCYTESUR  Radiological Exams on Admission: MR Brain Wo Contrast  Result Date: 11/23/2020 CLINICAL DATA:  Horizontal nystagmus, vertigo, paresthesia, dysgeusia. EXAM: MRI HEAD WITHOUT CONTRAST TECHNIQUE: Multiplanar, multiecho pulse sequences of the brain and surrounding structures were obtained without intravenous contrast. COMPARISON:  None. FINDINGS: Brain: No acute infarction, hemorrhage, hydrocephalus, extra-axial collection or mass lesion. Scattered foci of T2 hyperintensity are seen within the white matter of the cerebral hemispheres, including deep, juxta cortical, periventricular white matter and corpus callosum, as well as left cerebral peduncle, left side of the pons and bilateral middle  cerebellar peduncles and cerebellar hemispheres. Vascular: Normal flow voids. Skull and upper cervical spine: Normal marrow signal. Sinuses/Orbits: Negative. IMPRESSION: Scattered foci of T2 hyperintensity within the white matter of the cerebral hemispheres, including deep, juxta cortical, periventricular white matter and corpus callosum, as well as left cerebral peduncle, left side of the pons and bilateral middle cerebellar peduncles and cerebellar hemispheres. While nonspecific, findings are highly concerning for demyelinating disease. Electronically Signed   By: Baldemar Lenis M.D.   On: 11/23/2020 13:15   MR BRAIN W CONTRAST  Result Date: 11/23/2020 CLINICAL DATA:  Dizziness. No contrast MRI suggestive of demyelinating disease. EXAM: MRI HEAD WITH CONTRAST TECHNIQUE: Multiplanar, multiecho pulse sequences of the brain and surrounding structures were obtained with intravenous contrast. CONTRAST:  32mL GADAVIST GADOBUTROL 1 MMOL/ML IV SOLN COMPARISON:  MRI of the brain November 23, 2018 at 12:41 p.m. FINDINGS: 3D FLAIR images again demonstrate multiple supratentorial and infratentorial hyperintense lesions concerning for demyelinating plaques. Postcontrast images show contrast enhancement associated with a large plaque within the left middle cerebellar peduncle, consistent with active demyelination. No other focal is of abnormal contrast enhancement. IMPRESSION: Contrast enhancement associated with a large plaque within the left middle cerebellar peduncle, consistent with active demyelination. Electronically Signed   By: Baldemar Lenis M.D.   On: 11/23/2020 21:27    Assessment/Plan Principal Problem:   Multiple sclerosis  Mr. Longest has abnormal MRI of the brain with demyelinating region consistent with sclerosis.  He has been seen by neurology and will have a MRI of the C-spine with and without contrast tomorrow as this will need to be done more than 12 hours after the scan  with contrast today.  He has family history of multiple sclerosis as well as autoimmune disease and neurology is ordered B12 level, ACE level, lupus panel, vitamin D, anti-[place] and anti-MOG antibody tests.  We will follow up with Dr. Epimenio Foot as an outpatient for neurology follow-up.  Neurology will follow in the hospital. He is being treated with high-dose Solu-Medrol daily for the next 5 days  Active Problems:   Essential hypertension Continue home lisinopril dose.  Monitor blood pressure    Vertigo Most likely secondary to the MS.  Consult physical therapy for evaluation in the morning and training of vestibular exercises    DVT prophylaxis: Early ambulation. Low Padua score Code Status:   Full Code  Family Communication:  Notes and plan discussed with patient and his mother who is at bedside.  Questions were answered.  Further recommendations to follow as clinically indicated Disposition Plan:   Patient is from:  Home  Anticipated  DC to:  Home  Anticipated DC date:  Anticipate more than 2 midnight stay in the hospital to treat acute condition  Anticipated DC barriers: No barriers to discharge identified at this time  Consults called:  Neurology-Dr. Wilford Corner Admission status:  Inpatient   Claudean Severance Soliana Kitko MD [facility]  How to contact the [facility] Attending or Consulting provider 7A - 7P or covering provider during after hours 7P -7A, for this patient?   1. Check the care team in [facility] and look for a) attending/consulting [facility] provider listed and b) the [facility] team listed 2. Log into [email or web address] and use [place]'s universal password to access. If you do not have the password, please contact the hospital operator. 3. Locate the [facility] provider you are looking for under [facility] and page to a number that you can be directly reached. 4. If you still have difficulty reaching the provider, please page the [facility] (Director on Call) for the Hospitalists listed on amion for  assistance.  11/23/2020, 10:33 PM

## 2020-11-23 NOTE — Progress Notes (Signed)
   Sean Fox. is a 20 y.o. male who presents today for an office visit.  Assessment/Plan:  New/Acute Problems: Dizziness Patient with several other symptoms including nausea/vomiting, decreased visual acuity, decreased hearing, and abnormal sensation on his face.  Will get stat MRI imaging today.  We will give Zofran to help with his nausea and encouraged good oral hydration.  Depending on results of scan will need to follow-up with ENT or neurology.    Subjective:  HPI:  Patient here for dizziness.  Symptoms started 4 days ago.  Progressively got worse.  Went to the ED yesterday due to worsening symptoms.  Was given meclizine.  Symptoms have not improved since then.  Associated symptoms include decreased hearing in his left ear with occasional ringing in his ear.  Decreased sensation on the left side of his face and abnormal sense of taste.  His vision has been more blurred. He has not been able to tolerate any food or liquid by mouth for the last day or so.  He has been having dry heaves but no vomiting.  Feels very unsteady when walking.  No obvious precipitating events.  Does have a history of allergic rhinitis but is not sure if this is contributing.  No specific treatments tried.       Objective:  Physical Exam: There were no vitals taken for this visit.  Gen: No acute distress, resting comfortably HEENT: TMs clear bilaterally.  Horizontal nystagmus noted at rest.  Extraocular odd-numbered intact.  Pupils equally round and reactive to light. CV: Regular rate and rhythm with no murmurs appreciated Pulm: Normal work of breathing, clear to auscultation bilaterally with no crackles, wheezes, or rhonchi Neuro: Horizontal nystagmus noted.  Decreased sensation on left face.  But otherwise cranial nerves II through XII intact.  Strength 5 out of 5 in upper and lower extremities.  Finger-nose-finger testing intact bilaterally.  Heel-to-shin testing intact in bilateral lower  extremities. Psych: Normal affect and thought content  Time Spent: 50 minutes of total time was spent on the date of the encounter performing the following actions: chart review prior to seeing the patient, obtaining history, performing a medically necessary exam including complete neuro exam, counseling on the treatment plan including need for STAT imaging, placing orders, and documenting in our EHR.        Katina Degree. Jimmey Ralph, MD 11/23/2020 8:05 AM

## 2020-11-23 NOTE — ED Triage Notes (Signed)
Patient sent after MRI to be evaluated for MS and acute treatment. Reports some dizziness and facial numbness and decreased hearing

## 2020-11-23 NOTE — Consult Note (Signed)
Neurology Consultation  Reason for Consult: dizziness, abnormal brain MRI Referring Physician: Dr Bernette Mayers - EDP  CC: dizziness,   History is obtained from: Patient, mother, chart  HPI: Sean Fox. is a 20 y.o. male past medical history of hypertension, presented to the emergency room for 3 to 4 days worth of dizziness.  He describes that he was in his usual state of health till Friday, 11/18/2020 and on Saturday, 11/19/2020, started noticing some dizziness which he describes as feeling unsteady.  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  The dizziness started to get worse starting Monday and is worse today than it was at the start.  He was seen yesterday at [facility] ER and had no red flags and was discharged home on meclizine.  He comes in today with continuing dizziness and on examination by the ED provider had nystagmus which prompted brain imaging-brain MRI without contrast was done which showed multiple T2/flair hyperintense lesions which are periventricular as well as infratentorially and the cerebellum and brainstem.  I was called on the phone for further recommendations and recommended that MRI brain with contrast be performed.  MR brain with contrast revealed enhancement in the lesions as described below. I saw and evaluated the patient and obtain the history above. He denies any current weakness but says that he feels some tingling and paresthesias on the left face.  When asked about any prior episodes of visual symptoms, he says that he cannot remember when but he has had episodes of blurred vision that lasted a few days to a week before they got better-could not tell me whether it was painful or painless. Mother was at bedside and reported that she has multiple family members including brother, niece and grandparents who had MS and one of the family members-the mother's brother, passed  away due to complications of MS at a very young age in his 16s. Does not describe worsening of symptoms with heat.  Does not describe any electric shocklike sensation down the spine.    ROS: Performed and negative except as noted in HPI.  Past Medical History:  Diagnosis Date  . Eczema   . Hypertension     Family History  Problem Relation Age of Onset  . Hypertension Mother   . Hypertension Father   . Migraines Neg Hx   . Seizures Neg Hx   . Autism Neg Hx   . ADD / ADHD Neg Hx   . Anxiety disorder Neg Hx   . Depression Neg Hx   . Bipolar disorder Neg Hx   . Schizophrenia Neg Hx     Social History:   reports that he has never smoked. He has never used smokeless tobacco. He reports that he does not drink alcohol and does not use drugs.  Medications No current facility-administered medications for this encounter.  Current Outpatient Medications:  .  acetaminophen (TYLENOL) 325 MG tablet, Take 650 mg by mouth every 6 (six) hours as needed for mild pain, fever or headache., Disp: , Rfl:  .  cetirizine (ZYRTEC) 10 MG tablet, Take 10 mg by mouth daily., Disp: , Rfl: 6 .  Cholecalciferol 25 MCG (1000 UT) tablet, Take 2,000 Units by mouth daily., Disp: , Rfl:  .  lisinopril (ZESTRIL) 5 MG tablet, Take 1 tablet (5 mg total) by mouth daily., Disp: 90 tablet, Rfl: 1 .  meclizine (ANTIVERT) 25 MG tablet, Take 1 tablet (25 mg total) by mouth 3 (three) times daily as needed for dizziness., Disp: 15 tablet, Rfl: 0 .  Multiple Vitamin (MULTIVITAMIN WITH MINERALS) TABS tablet, Take 1 tablet by mouth daily., Disp: , Rfl:  .  ondansetron (ZOFRAN ODT) 8 MG disintegrating tablet, Take 1 tablet (8 mg total) by mouth every 8 (eight) hours as needed for nausea or vomiting., Disp: 20 tablet, Rfl: 0 .  SUPER B COMPLEX/C PO, Take 1 tablet by mouth daily., Disp: , Rfl:    Exam: Current vital signs: BP 140/66 (BP Location: Left Arm)   Pulse 74   Temp 98.5 F (36.9 C) (Oral)   Resp 14   SpO2 100%   Vital signs in last 24 hours: Temp:  [97.6 F (36.4 C)-99.2 F (37.3 C)] 98.5 F (36.9 C) (02/09 1930) Pulse Rate:  [72-94] 74 (02/09 1930) Resp:  [14-18] 14 (02/09 1930) BP: (127-151)/(66-77) 140/66 (02/09 1930) SpO2:  [98 %-100 %] 100 % (02/09 1930) Weight:  [79.1 kg] 79.1 kg (02/09 0813) General: Awake alert in no distress HEENT: Normocephalic, atraumatic, dry oral mucous membranes, no thyromegaly Lungs clear to auscultation without wheezing Cardiovascular: Regular rate rhythm, no murmur rub gallop Abdomen is soft nondistended with normal bowel sounds Extremities are warm well perfused, no peripheral edema Neurological exam Awake alert oriented x3 No aphasia No dysarthria CN: PERRL, no RAPD, EOM shows bidirectional nystagmus with fast component to the right on left and right gaze, worse on left gaze, intermittent diplopia, facial sensation with some diminishing sensation on the left cheek in comparison to the right, face is symmetric, tongue and palate midline. Motor exam with no drift and symmetric 5/5 strength all over Normal tone normal range of motion Sensory exam: As above-on the face.  Otherwise on the body, no asymmetry Coordination: No dysmetria Gait testing deferred at this time  Labs I have reviewed labs in epic and the results pertinent to this consultation are:  CBC    Component Value Date/Time   WBC 6.2 11/22/2020 1101   RBC 4.86 11/22/2020 1101   HGB 15.4 11/22/2020 1101   HCT 45.9 11/22/2020 1101   PLT 263 11/22/2020 1101   MCV 94.4 11/22/2020 1101   MCH 31.7 11/22/2020 1101   MCHC 33.6 11/22/2020 1101   RDW 12.3 11/22/2020 1101   LYMPHSABS 1.1 11/22/2020 1101   MONOABS 0.3 11/22/2020 1101   EOSABS 0.0 11/22/2020 1101   BASOSABS 0.0 11/22/2020 1101    CMP     Component Value Date/Time   NA 140 11/22/2020 1101   K 4.4 11/22/2020 1101   CL 105 11/22/2020 1101   CO2 27 11/22/2020 1101   GLUCOSE 99 11/22/2020 1101   BUN 14 11/22/2020 1101    CREATININE 0.78 11/22/2020 1101   CREATININE 0.99 11/21/2018 1406   CALCIUM 9.7 11/22/2020 1101   PROT 8.1 11/22/2020 1101   ALBUMIN 4.8 11/22/2020 1101   AST 17 11/22/2020 1101   ALT 19 11/22/2020 1101   ALKPHOS 60 11/22/2020 1101   BILITOT 0.6 11/22/2020 1101   GFRNONAA >60 11/22/2020 1101    Imaging I have reviewed the images obtained: MRI of the brain with and without contrast-noncontrasted images show scattered foci of T2 hyperintensity within the white matter of the cerebral hemispheres including deep structure cortical periventricular white matter and corpus callosum as well as the left cerebral peduncle, left pons and bilateral middle cerebral peduncles and cerebellar hemispheres.  Postcontrast imaging shows contrast-enhancement associated  with large plaque within the left middle cerebral peduncle consistent with active demyelination.  There is also concern for a small area of enhancement in the brainstem-which could be artifactual.  Assessment: 20 year old man with history of hypertension presenting for evaluation of dizziness that has been going on since last Saturday and brain imaging consistent with scattered foci of T2 hyperintensity with postcontrast imaging showing enhancement and a lesion in the cerebral peduncle. His lesion distribution is periventricular, round to oval lesions touching the ventricle along with involvement of the cerebral peduncles and the cerebellar hemispheres making a strong suspicion for multiple sclerosis/demyelination. He would require further imaging of the C-spine but treatment at this point would be high-dose steroids. I would also order anti NMO  antibodies to evaluate for neuromyelitis optica and anti-MOG antibodies.  Impression: Demyelinating disease of the brain with active demyelination Likely multiple sclerosis Evaluate for neuromyelitis optica and anti-MOG antibody related demyelination  Recommendations: Check B12 levels, ACE levels, lupus  panel and vitamin D levels 5 days of IV Solu-Medrol 1 g daily Follow-up with Dr. Epimenio Foot as an outpatient MRI C-spine with and without contrast tomorrow-he will need at least 12 hours before contrast administration from the scan today. Anti NMO and Anti-MOG antibodies  Neurology will follow D/W Dr. Bernette Mayers  -- Milon Dikes, MD Neurologist [facility] Pager: [phone]

## 2020-11-24 ENCOUNTER — Inpatient Hospital Stay (HOSPITAL_COMMUNITY): Payer: BC Managed Care – PPO

## 2020-11-24 DIAGNOSIS — G35 Multiple sclerosis: Secondary | ICD-10-CM | POA: Diagnosis not present

## 2020-11-24 LAB — CBC
HCT: 48 % (ref 39.0–52.0)
Hemoglobin: 15.7 g/dL (ref 13.0–17.0)
MCH: 30.7 pg (ref 26.0–34.0)
MCHC: 32.7 g/dL (ref 30.0–36.0)
MCV: 93.8 fL (ref 80.0–100.0)
Platelets: 277 10*3/uL (ref 150–400)
RBC: 5.12 MIL/uL (ref 4.22–5.81)
RDW: 11.9 % (ref 11.5–15.5)
WBC: 6.7 10*3/uL (ref 4.0–10.5)
nRBC: 0 % (ref 0.0–0.2)

## 2020-11-24 LAB — VITAMIN B12: Vitamin B-12: 747 pg/mL (ref 180–914)

## 2020-11-24 LAB — BASIC METABOLIC PANEL
Anion gap: 11 (ref 5–15)
BUN: 14 mg/dL (ref 6–20)
CO2: 24 mmol/L (ref 22–32)
Calcium: 9.7 mg/dL (ref 8.9–10.3)
Chloride: 105 mmol/L (ref 98–111)
Creatinine, Ser: 1 mg/dL (ref 0.61–1.24)
GFR, Estimated: >60 mL/min (ref >60)
Glucose, Bld: 126 mg/dL — ABNORMAL HIGH (ref 70–99)
Potassium: 4.1 mmol/L (ref 3.5–5.1)
Sodium: 140 mmol/L (ref 135–145)

## 2020-11-24 LAB — HIV ANTIBODY (ROUTINE TESTING W REFLEX): HIV Screen 4th Generation wRfx: NONREACTIVE

## 2020-11-24 LAB — VITAMIN D 25 HYDROXY (VIT D DEFICIENCY, FRACTURES): Vit D, 25-Hydroxy: 22.49 ng/mL — ABNORMAL LOW (ref 30–100)

## 2020-11-24 MED ORDER — VITAMIN D (ERGOCALCIFEROL) 1.25 MG (50000 UNIT) PO CAPS
50000.0000 [IU] | ORAL_CAPSULE | ORAL | Status: DC
  Administered 2020-11-24: 50000 [IU] via ORAL
  Filled 2020-11-24: qty 1

## 2020-11-24 MED ORDER — ADULT MULTIVITAMIN W/MINERALS CH
1.0000 | ORAL_TABLET | Freq: Every day | ORAL | Status: DC
  Administered 2020-11-24 – 2020-11-27 (×4): 1 via ORAL
  Filled 2020-11-24 (×4): qty 1

## 2020-11-24 MED ORDER — GADOBUTROL 1 MMOL/ML IV SOLN
8.0000 mL | Freq: Once | INTRAVENOUS | Status: AC | PRN
  Administered 2020-11-24: 8 mL via INTRAVENOUS

## 2020-11-24 NOTE — Progress Notes (Signed)
[facility] Progress Note  Patient: Sean Fox.    OBS:[id number]  DOA: 11/23/2020     Date of Service: the patient was seen and examined on 11/24/2020  Brief hospital course: Past medical history of HTN.  Presents with dizziness and lightheadedness as well as left-sided paresthesia.  Found to have MS. Multiple family member with MS history. Currently on high-dose IV steroids. Currently plan is completed therapy and monitor for improvement in symptoms.  Assessment and Plan: 1.  Demyelinating disease of the brain with active demyelination seen on MRI. Likely multiple sclerosis Neurology consulted. MRI brain concerning for demyelinating lesions. MRI C-spine currently pending. Currently started on IV Solu-Medrol 1 g daily for 5 days. Other work-up initiated to rule out neuromyelitis optica, anti-MOG antibody related demyelination as well as mental deficiency. Tolerating p.o. diet. No other acute complaints. monitor on neuro checks.  2.  Essential hypertension Blood pressure mildly elevated. Continue lisinopril.  Discontinue HCTZ  3.  Vertigo Related to his MS presentation. Monitor for now.  Diet: Regular diet DVT Prophylaxis: Subcutaneous Heparin       Advance goals of care discussion: Full code  Family Communication: family was present at bedside, at the time of interview.  The pt provided permission to discuss medical plan with the family. Opportunity was given to ask question and all questions were answered satisfactorily.   Disposition:  Status is: Inpatient  Remains inpatient appropriate because:Inpatient level of care appropriate due to severity of illness   Dispo:  Patient From: Home  Planned Disposition: Home  Expected discharge date: 11/27/2020  Medically stable for discharge: No          Subjective: No nausea no vomiting.  No fever no chills.  No chest pain.  No abdominal pain.  Continues to have left-sided paresthesia.  Also reports dizziness.   No vertigo.  Physical Exam:  General: Appear in mild distress, no Rash; Oral Mucosa Clear, moist. no Abnormal Neck Mass Or lumps, Conjunctiva normal  Cardiovascular: S1 and S2 Present, no Murmur, Respiratory: good respiratory effort, Bilateral Air entry present and CTA, no Crackles, no wheezes Abdomen: Bowel Sound present, Soft and no tenderness Extremities: no Pedal edema Neurology: alert and oriented to time, place, and person affect appropriate. no new focal deficit, left-sided facial numbness. Gait not checked due to patient safety concerns  Vitals:   11/24/20 1830 11/24/20 1845 11/24/20 1900 11/24/20 1915  BP: 128/86 137/75 140/75 (!) 141/83  Pulse: (!) 102 86 84 (!) 101  Resp: 17 (!) 22 20 18   Temp:      TempSrc:      SpO2: 99% 100% 100% 100%  Weight:      Height:        Intake/Output Summary (Last 24 hours) at 11/24/2020 1923 Last data filed at 11/24/2020 1700 Gross per 24 hour  Intake 50 ml  Output 300 ml  Net -250 ml   Filed Weights   11/24/20 1549  Weight: 79.4 kg    Data Reviewed: I have personally reviewed and interpreted daily labs, tele strips, imaging. I reviewed all nursing notes, pharmacy notes, vitals, pertinent old records I have discussed plan of care as described above with RN and patient/family.  CBC: Recent Labs  Lab 11/22/20 1101 11/24/20 0500  WBC 6.2 6.7  NEUTROABS 4.7  --   HGB 15.4 15.7  HCT 45.9 48.0  MCV 94.4 93.8  PLT 263 277   Basic Metabolic Panel: Recent Labs  Lab 11/22/20 1101 11/24/20 0500  NA  140 140  K 4.4 4.1  CL 105 105  CO2 27 24  GLUCOSE 99 126*  BUN 14 14  CREATININE 0.78 1.00  CALCIUM 9.7 9.7    Studies: MR BRAIN W CONTRAST  Result Date: 11/23/2020 CLINICAL DATA:  Dizziness. No contrast MRI suggestive of demyelinating disease. EXAM: MRI HEAD WITH CONTRAST TECHNIQUE: Multiplanar, multiecho pulse sequences of the brain and surrounding structures were obtained with intravenous contrast. CONTRAST:  30mL  GADAVIST GADOBUTROL 1 MMOL/ML IV SOLN COMPARISON:  MRI of the brain November 23, 2018 at 12:41 p.m. FINDINGS: 3D FLAIR images again demonstrate multiple supratentorial and infratentorial hyperintense lesions concerning for demyelinating plaques. Postcontrast images show contrast enhancement associated with a large plaque within the left middle cerebellar peduncle, consistent with active demyelination. No other focal is of abnormal contrast enhancement. IMPRESSION: Contrast enhancement associated with a large plaque within the left middle cerebellar peduncle, consistent with active demyelination. Electronically Signed   By: Baldemar Lenis M.D.   On: 11/23/2020 21:27    Scheduled Meds: . lisinopril  5 mg Oral Daily  . multivitamin with minerals  1 tablet Oral Daily  . Vitamin D (Ergocalciferol)  50,000 Units Oral Q7 days   Continuous Infusions: . lactated ringers 75 mL/hr at 11/24/20 0009  . methylPREDNISolone (SOLU-MEDROL) injection Stopped (11/24/20 1550)   PRN Meds: acetaminophen, meclizine, ondansetron **OR** ondansetron (ZOFRAN) IV  Time spent: 35 minutes  Author: Lynden Oxford, MD [facility] 11/24/2020 7:23 PM  To reach On-call, see care teams to locate the attending and reach out via [email or web address]. Between 7PM-7AM, please contact night-coverage If you still have difficulty reaching the attending provider, please page the [facility] (Director on Call) for [facility] on amion for assistance.

## 2020-11-24 NOTE — ED Notes (Signed)
Family at bedside. Pt's mother is sitting with pt. Everyone seems calm and happy to be in a room.

## 2020-11-24 NOTE — ED Notes (Signed)
Pt and family asking when MRI will be done. MRI ordered however not yet done. Called MRI to inquire about pending MRI. Order notes state to perform MRI tomorrow due to recent contrast. Pt and family updated.

## 2020-11-24 NOTE — ED Notes (Signed)
Pt's BP was 145/82. Informed Sarah-RN

## 2020-11-24 NOTE — ED Notes (Signed)
Breakfast Ordered 

## 2020-11-25 DIAGNOSIS — G35 Multiple sclerosis: Secondary | ICD-10-CM | POA: Diagnosis not present

## 2020-11-25 LAB — MISC LABCORP TEST (SEND OUT): Labcorp test code: 505310

## 2020-11-25 LAB — EXTRACTABLE NUCLEAR ANTIGEN ANTIBODY
ENA SM Ab Ser-aCnc: <0.2 AI (ref 0.0–0.9)
Ribonucleic Protein: 0.2 AI (ref 0.0–0.9)
SSA (Ro) (ENA) Antibody, IgG: <0.2 AI (ref 0.0–0.9)
SSB (La) (ENA) Antibody, IgG: <0.2 AI (ref 0.0–0.9)
Scleroderma (Scl-70) (ENA) Antibody, IgG: <0.2 AI (ref 0.0–0.9)
ds DNA Ab: <1 IU/mL (ref 0–9)

## 2020-11-25 LAB — NEUROMYELITIS OPTICA AUTOAB, IGG: NMO-IgG: <1.5 U/mL (ref 0.0–3.0)

## 2020-11-25 LAB — ANGIOTENSIN CONVERTING ENZYME: Angiotensin-Converting Enzyme: 21 U/L (ref 14–82)

## 2020-11-25 LAB — ANTI-JO 1 ANTIBODY, IGG: Anti JO-1: <0.2 AI (ref 0.0–0.9)

## 2020-11-25 NOTE — ED Notes (Signed)
Breakfast ordered 

## 2020-11-25 NOTE — ED Notes (Signed)
Attempted to call report

## 2020-11-25 NOTE — Progress Notes (Signed)
[facility] Progress Note  Patient: Sean Fox.    VZD:[id number]  DOA: 11/23/2020     Date of Service: the patient was seen and examined on 11/25/2020  Brief hospital course: Past medical history of HTN.  Presents with dizziness and lightheadedness as well as left-sided paresthesia.  Found to have MS. Multiple family member with MS history. Currently on high-dose IV steroids. Currently plan is completed therapy and monitor for improvement in symptoms.  Assessment and Plan: 1.  Demyelinating disease of the brain with active demyelination seen on MRI. Likely multiple sclerosis Neurology consulted. MRI brain concerning for demyelinating lesions. MRI C-spine shows demyelinating lesion as well. Neurology will follow up on the patient tomorrow. Currently started on IV Solu-Medrol 1 g daily for 5 days.  Day 3 today. Patient reports mild improvement in his symptoms. Other work-up initiated to rule out neuromyelitis optica, anti-MOG antibody related demyelination as well as mental deficiency. Tolerating p.o. diet. No other acute complaints. monitor on neuro checks.  2.  Essential hypertension Blood pressure mildly elevated. Continue lisinopril.  Discontinue both antihypertensive medication.  Patient was also given IV fluids which I will discontinue.  3.  Vertigo Related to his MS presentation.  Improving.  As needed meclizine. Monitor for now.  Diet: Regular diet DVT Prophylaxis: Subcutaneous Heparin       Advance goals of care discussion: Full code  Family Communication: family was present at bedside, at the time of interview.  The pt provided permission to discuss medical plan with the family. Opportunity was given to ask question and all questions were answered satisfactorily.   Disposition:  Status is: Inpatient  Remains inpatient appropriate because:Inpatient level of care appropriate due to severity of illness   Dispo:  Patient From: Home  Planned Disposition:  Home  Expected discharge date: 11/27/2020  Medically stable for discharge: No          Subjective: Reports dizziness but improving.  Able to go to the bathroom without any concern.  No nausea no vomiting.  Continues to have some tingling on the left side of the face but resolved as well.  And improving.  Physical Exam:  General: Appear in mild distress, no Rash; Oral Mucosa Clear, moist. no Abnormal Neck Mass Or lumps, Conjunctiva normal  Cardiovascular: S1 and S2 Present, no Murmur, Respiratory: good respiratory effort, Bilateral Air entry present and CTA, no Crackles, no wheezes Abdomen: Bowel Sound present, Soft and no tenderness Extremities: no Pedal edema Neurology: alert and oriented to time, place, and person affect appropriate. no new focal deficit, Persistent left-sided numbness, left-sided finger-nose-finger test abnormal. Gait not checked due to patient safety concerns  Vitals:   11/25/20 1000 11/25/20 1357 11/25/20 1400 11/25/20 1500  BP: 131/89 124/69 129/63 (!) 117/54  Pulse: 96 (!) 101 77 75  Resp: 14 20 16 15   Temp:      TempSrc:      SpO2: 100% 100% 99% 97%  Weight:      Height:        Intake/Output Summary (Last 24 hours) at 11/25/2020 1656 Last data filed at 11/24/2020 1700 Gross per 24 hour  Intake -  Output 300 ml  Net -300 ml   Filed Weights   11/24/20 1549  Weight: 79.4 kg    Data Reviewed: I have personally reviewed and interpreted daily labs, tele strips, imaging. I reviewed all nursing notes, pharmacy notes, vitals, pertinent old records I have discussed plan of care as described above with RN and patient/family.  CBC: Recent Labs  Lab 11/22/20 1101 11/24/20 0500  WBC 6.2 6.7  NEUTROABS 4.7  --   HGB 15.4 15.7  HCT 45.9 48.0  MCV 94.4 93.8  PLT 263 277   Basic Metabolic Panel: Recent Labs  Lab 11/22/20 1101 11/24/20 0500  NA 140 140  K 4.4 4.1  CL 105 105  CO2 27 24  GLUCOSE 99 126*  BUN 14 14  CREATININE 0.78 1.00   CALCIUM 9.7 9.7    Studies: MR CERVICAL SPINE W WO CONTRAST  Result Date: 11/24/2020 CLINICAL DATA:  Demyelinating disease EXAM: MRI CERVICAL SPINE WITHOUT AND WITH CONTRAST TECHNIQUE: Multiplanar and multiecho pulse sequences of the cervical spine, to include the craniocervical junction and cervicothoracic junction, were obtained without and with intravenous contrast. CONTRAST:  18mL GADAVIST GADOBUTROL 1 MMOL/ML IV SOLN COMPARISON:  Brain studies done yesterday. 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 Cord: Abnormal T2 signal within the cord at the C2-3 level and the C3-4 level consistent with MS plaques. No abnormal contrast enhancement. Posterior Fossa, vertebral arteries, paraspinal tissues: See results of brain MRI. 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 Disc levels: No disc abnormality at C2-3. C3-4: Mild bulging of the disc.  No compressive stenosis. C4-5: No disc pathology.  No stenosis. C5-6: Mild bulging of the disc.  No compressive stenosis. C6-7: Congenital failure of segmentation. Wide patency of the canal and foramina. C7-T1: Mild uncovertebral prominence on the left. No compressive canal or foraminal narrowing. T1-2: Normal IMPRESSION: 1. Abnormal T2 signal affecting the cord at the C2-3 and C3-4 levels consistent with demyelinating disease. No abnormal contrast enhancement. 2. 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 3. Mild non-compressive disc bulges at C3-4 and C5-6. 4. Mild left uncovertebral prominence at C7-T1 without compressive stenosis. Electronically Signed   By: Paulina Fusi M.D.   On: 11/24/2020 22:46    Scheduled Meds: . multivitamin with minerals  1 tablet Oral Daily  . Vitamin D (Ergocalciferol)  50,000 Units Oral Q7 days   Continuous Infusions: . methylPREDNISolone (SOLU-MEDROL) injection Stopped (11/25/20 1006)   PRN Meds: acetaminophen, meclizine, ondansetron **OR** ondansetron (ZOFRAN) IV  Time  spent: 35 minutes  Author: Lynden Oxford, MD [facility] 11/25/2020 4:56 PM  To reach On-call, see care teams to locate the attending and reach out via [email or web address]. Between 7PM-7AM, please contact night-coverage If you still have difficulty reaching the attending provider, please page the [facility] (Director on Call) for [facility] on amion for assistance.

## 2020-11-25 NOTE — ED Notes (Signed)
Lunch Tray Ordered @ 1030. 

## 2020-11-26 DIAGNOSIS — G35 Multiple sclerosis: Secondary | ICD-10-CM | POA: Diagnosis not present

## 2020-11-26 LAB — CBC
HCT: 44 % (ref 39.0–52.0)
Hemoglobin: 14.9 g/dL (ref 13.0–17.0)
MCH: 31.4 pg (ref 26.0–34.0)
MCHC: 33.9 g/dL (ref 30.0–36.0)
MCV: 92.6 fL (ref 80.0–100.0)
Platelets: 266 10*3/uL (ref 150–400)
RBC: 4.75 MIL/uL (ref 4.22–5.81)
RDW: 11.9 % (ref 11.5–15.5)
WBC: 20.2 10*3/uL — ABNORMAL HIGH (ref 4.0–10.5)
nRBC: 0 % (ref 0.0–0.2)

## 2020-11-26 LAB — BASIC METABOLIC PANEL
Anion gap: 10 (ref 5–15)
BUN: 17 mg/dL (ref 6–20)
CO2: 25 mmol/L (ref 22–32)
Calcium: 9.3 mg/dL (ref 8.9–10.3)
Chloride: 105 mmol/L (ref 98–111)
Creatinine, Ser: 0.89 mg/dL (ref 0.61–1.24)
GFR, Estimated: >60 mL/min (ref >60)
Glucose, Bld: 109 mg/dL — ABNORMAL HIGH (ref 70–99)
Potassium: 4.2 mmol/L (ref 3.5–5.1)
Sodium: 140 mmol/L (ref 135–145)

## 2020-11-26 NOTE — Progress Notes (Signed)
[facility] Progress Note  Patient: Sean Fox.    RJJ:[id number]  DOA: 11/23/2020     Date of Service: the patient was seen and examined on 11/26/2020  Brief hospital course: Past medical history of HTN.  Presents with dizziness and lightheadedness as well as left-sided paresthesia.  Found to have MS. Multiple family member with MS history. Currently on high-dose IV steroids. Currently plan is completed therapy and monitor for improvement in symptoms.  Assessment and Plan: 1.  Demyelinating disease of the brain with active demyelination seen on MRI. Likely multiple sclerosis Neurology consulted. MRI brain concerning for demyelinating lesions. MRI C-spine shows demyelinating lesion as well. Currently started on IV Solu-Medrol 1 g daily for 5 days.  Day 4 today. Patient reports mild improvement in his symptoms. Other work-up anti-MOG, antianimal antibodies negative, B12, ACE level, SLE panel within normal limits, vitamin D level low currently being replaced. Tolerating p.o. diet. No other acute complaints.  Continues to have some numbness but gradually improving as well as dizziness. monitor on neuro checks. Outpatient follow-up with Dr. Epimenio Foot  2.  Essential hypertension Blood pressure mildly elevated. Continue lisinopril.  Discontinue both antihypertensive medication.  Patient was also given IV fluids which I will discontinue.  3.  Vertigo Related to his MS presentation.  Improving.  As needed meclizine. Monitor for now.  4.  Vitamin D deficiency. On 50,000 weekly dose.  Diet: Regular diet DVT Prophylaxis: Subcutaneous Heparin    Advance goals of care discussion: Full code  Family Communication: family was present at bedside, at the time of interview.  The pt provided permission to discuss medical plan with the family. Opportunity was given to ask question and all questions were answered satisfactorily.   Disposition:  Status is: Inpatient  Remains inpatient  appropriate because:Inpatient level of care appropriate due to severity of illness   Dispo:  Patient From: Home  Planned Disposition: Home  Expected discharge date: 11/27/2020  Medically stable for discharge: No    Subjective: No nausea no vomiting.  No fever no chills.  No chest pain.  Physical Exam:  General: Appear in mild distress, no Rash; Oral Mucosa Clear, moist. no Abnormal Neck Mass Or lumps, Conjunctiva normal  Cardiovascular: S1 and S2 Present, no Murmur, Respiratory: good respiratory effort, Bilateral Air entry present and CTA, no Crackles, no wheezes Abdomen: Bowel Sound present, Soft and no tenderness Extremities: no Pedal edema Neurology: alert and oriented to time, place, and person affect appropriate. no new focal deficit, continues to have some left-sided facial numbness. Gait not checked due to patient safety concerns  Vitals:   11/26/20 0351 11/26/20 0828 11/26/20 1201 11/26/20 1530  BP: 120/73 134/75 (!) 130/55 140/78  Pulse: 75 78 77 86  Resp: 16 18 18 16   Temp: 97.6 F (36.4 C) 97.8 F (36.6 C) 97.7 F (36.5 C) 98.1 F (36.7 C)  TempSrc: Oral Oral Oral Oral  SpO2: 99% 99% 98% 98%  Weight:      Height:        Intake/Output Summary (Last 24 hours) at 11/26/2020 1720 Last data filed at 11/25/2020 1800 Gross per 24 hour  Intake 240 ml  Output -  Net 240 ml   Filed Weights   11/24/20 1549 11/25/20 1706  Weight: 79.4 kg 82.7 kg    Data Reviewed: I have personally reviewed and interpreted daily labs, tele strips, imaging. I reviewed all nursing notes, pharmacy notes, vitals, pertinent old records I have discussed plan of care as described above  with RN and patient/family.  CBC: Recent Labs  Lab 11/22/20 1101 11/24/20 0500 11/26/20 0541  WBC 6.2 6.7 20.2*  NEUTROABS 4.7  --   --   HGB 15.4 15.7 14.9  HCT 45.9 48.0 44.0  MCV 94.4 93.8 92.6  PLT 263 277 266   Basic Metabolic Panel: Recent Labs  Lab 11/22/20 1101 11/24/20 0500  11/26/20 0541  NA 140 140 140  K 4.4 4.1 4.2  CL 105 105 105  CO2 27 24 25   GLUCOSE 99 126* 109*  BUN 14 14 17   CREATININE 0.78 1.00 0.89  CALCIUM 9.7 9.7 9.3    Studies: No results found.  Scheduled Meds: . multivitamin with minerals  1 tablet Oral Daily  . Vitamin D (Ergocalciferol)  50,000 Units Oral Q7 days   Continuous Infusions: . methylPREDNISolone (SOLU-MEDROL) injection 1,000 mg (11/26/20 1001)   PRN Meds: acetaminophen, meclizine, ondansetron **OR** ondansetron (ZOFRAN) IV  Time spent: 35 minutes  Author: , MD [facility] 11/26/2020 5:20 PM  To reach On-call, see care teams to locate the attending and reach out via [email or web address]. Between 7PM-7AM, please contact night-coverage If you still have difficulty reaching the attending provider, please page the [facility] (Director on Call) for [facility] on amion for assistance.

## 2020-11-26 NOTE — Progress Notes (Signed)
Neurology Progress Note Subjective: Day 3/5 of IV steroids today. Patient endorses some improvement in his left facial numbness, gait instability, and blurred vision today. Claims that the room is still spinning but "is spinning less today". He also claims his numbness is still present in his face but that this feels less numb today than on admission. He states he is able to walk without the level of disequilibrium he felt on Saturday prior to hospital admission. He claims he still has to walk slowly but does have subjective improvement in ambulation. Of note, patient does endorse some intermittent diplopia with vision that is "stacked on top of each other" that has since resolved overnight but attributes this to eye fatigue late last night while watching television.   Exam: Vitals:   11/26/20 0351 11/26/20 0828  BP: 120/73 134/75  Pulse: 75 78  Resp: 16 18  Temp: 97.6 F (36.4 C) 97.8 F (36.6 C)  SpO2: 99% 99%   Gen: Awake, in bed, in no acute distress Resp: non-labored breathing, symmetric chest rise with inspiration Abd: soft, non-tender, non-distended  Neuro: Ment: awake, alert to person, place, age, month, year, and situation. Patient is a good historian and is able to give a clear and coherent history of present illness. Patient follows all commands. Speech is intact; naming, fluency, comprehension, and repetition are intact without dysarthria, aphasia, or neglect noted.  Cranial Nerves: II: Visual Fields are full. Pupils are equal, round, and reactive to light 4 mm/brisk. III,IV, VI: EOMI without ptosis. Right-beating nystagmus noted in all directions of gaze.   V: Facial sensation is asymmetric to temperature and light touch with mild decrease in sensation to the left upper cheek and forehead.   VII: Facial movement is symmetric resting and smiling VIII: Hearing is intact to voice X: Palate elevates symmetrically XI: Shoulder shrug is symmetric. XII: Tongue protrudes midline  without atrophy or fasciculations.  Motor: Tone is normal. Bulk is normal. 5/5 strength was present in all four extremities, spontaneous antigravity movement present in all extremities without pronator drift.  Sensory: Sensation is symmetric to light touch and temperature in the arms and legs. 2 point discrimination intact.  Deep Tendon Reflexes: 2+ and symmetric in the biceps and patellae. Plantars: Toes are downgoing bilaterally.  Cerebellar: FNF and HKS are intact bilaterally Gait: Deferred  Assessment: 19 year old male with probable MS exacerbation. He does not have a prior diagnosis of MS. He presented for evaluation of dizziness that had been ongoing since last Saturday 11/19/20.  - Brain imaging shows scattered foci of T2 hyperintensity with postcontrast imaging showing enhancement of a lesion in the cerebral peduncle; the findings are most consistent with multiple sclerosis. MRI C-spine with abnormal T2 signal affecting the cord at C2-3 and C3-4 levels consistent with demyelinating disease but without abnormal contrast enhancement.  - Significant family history of MS according to patient's mother further supports diagnosis. - Anti-MOG and anti-NMO antibodies negative; supportive of multiple sclerosis rather than neuromyelitis optica or anti-MOG antibody related demyelination - Overall history, symptoms, exam findings and imaging are most consistent with multiple sclerosis exacerbation. Of note, approximately one year ago patient describes an episode of right lower extremity numbess that resolved in 1-2 weeks. Current presentation with left upper facial numbness, visual disturbance with "room spinning" sensation, and disequilibrium with gait instability since 11/19/20.  - Five day course of IV Solu-Medrol initiated 2/09.  - Today the patient endorses subjective improvement in dizziness, left facial numbness, and vision disturbance - B12, ACE levels,  SLE panel within normal limits; Vitamin D low  (22.49 on 11/23/20)  Recommendations: - Continue Vitamin D supplementation  - 5 days of IV Solu-Medrol 1 g daily; start 11/23/20, end 2/13  - Consider adding protonix/PPI due to high dose steroid use   - Monitor blood glucose, suspect elevation with IV steroids  - Leukocytosis expected, reactive to Solumedrol   - Follow-up with Dr. Epimenio Foot as an outpatient - PT as needed for evaluation of patient ambulation - Neurology will continue to follow   Lanae Boast, AGACNP-BC [facility] [phone]  Electronically signed: Dr. Caryl Pina

## 2020-11-27 DIAGNOSIS — G35 Multiple sclerosis: Secondary | ICD-10-CM | POA: Diagnosis not present

## 2020-11-27 MED ORDER — VITAMIN D (ERGOCALCIFEROL) 1.25 MG (50000 UNIT) PO CAPS: 50000.0000 [IU] | ORAL_CAPSULE | ORAL | 0 refills | Status: DC

## 2020-11-27 NOTE — Evaluation (Signed)
Physical Therapy Evaluation Patient Details Name: Sean Fox. MRN: [id number] DOB: Nov 24, 2000 Today's Date: 11/27/2020   History of Present Illness  Pt is a 20 y.o. with significant PMH of HTN. Pt presents with dizziness, lightheadedness and left sided paresthesia. Found to have MS.  Clinical Impression  Prior to admission, pt lives with his parents, attends school and works in Consulting civil engineer; he enjoys Adult nurse games and cooking. Pt reports intermittent diplopia with reading up close and left ear ringing. He presents with mild balance deficits and decreased gait speed. Pt ambulating hallway distances and negotiated 5 steps without physical difficulty. Scoring 20/24 on the Dynamic Gait Index, indicating he is not at risk for falls. Gait speed of 2.19 ft/s indicates pt is at a limited community ambulator level. Would benefit from follow up OPPT to address deficits and promote neuro recovery.    Follow Up Recommendations Outpatient PT (neuro)    Equipment Recommendations  None recommended by PT    Recommendations for Other Services       Precautions / Restrictions Precautions Precautions: None Restrictions Weight Bearing Restrictions: No      Mobility  Bed Mobility Overal bed mobility: Independent                  Transfers Overall transfer level: Independent Equipment used: None                Ambulation/Gait Ambulation/Gait assistance: Modified independent (Device/Increase time) ((increased time)) Gait Distance (Feet): 400 Feet Assistive device: None Gait Pattern/deviations: Step-through pattern;Wide base of support Gait velocity: 2.19 ft/s Gait velocity interpretation: 1.31 - 2.62 ft/sec, indicative of limited community ambulator General Gait Details: No overt LOB  Stairs Stairs: Yes Stairs assistance: Modified independent (Device/Increase time) Stair Management: No rails Number of Stairs: 5    Wheelchair Mobility    Modified Rankin (Stroke Patients  Only)       Balance                                 Standardized Balance Assessment Standardized Balance Assessment : Dynamic Gait Index   Dynamic Gait Index Level Surface: Mild Impairment Change in Gait Speed: Mild Impairment Gait with Horizontal Head Turns: Normal Gait with Vertical Head Turns: Normal Gait and Pivot Turn: Normal Step Over Obstacle: Mild Impairment Step Around Obstacles: Mild Impairment Steps: Normal Total Score: 20       Pertinent Vitals/Pain Pain Assessment: No/denies pain    Home Living Family/patient expects to be discharged to:: Private residence Living Arrangements: Parent Available Help at Discharge: Family Type of Home: House Home Access: Stairs to enter Entrance Stairs-Rails: None Secretary/administrator of Steps: 3 Home Layout: One level Home Equipment: None      Prior Function Level of Independence: Independent         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   Dominant Hand: Left    Extremity/Trunk Assessment   Upper Extremity Assessment Upper Extremity Assessment: Overall WFL for tasks assessed    Lower Extremity Assessment Lower Extremity Assessment: Overall WFL for tasks assessed    Cervical / Trunk Assessment Cervical / Trunk Assessment: Normal  Communication   Communication: No difficulties  Cognition Arousal/Alertness: Awake/alert Behavior During Therapy: WFL for tasks assessed/performed Overall Cognitive Status: Within Functional Limits for tasks assessed  General Comments      Exercises     Assessment/Plan    PT Assessment Patent does not need any further PT services  PT Problem List Decreased balance;Decreased mobility       PT Treatment Interventions      PT Goals (Current goals can be found in the Care Plan section)  Acute Rehab PT Goals Patient Stated Goal: return home PT Goal Formulation: All  assessment and education complete, DC therapy    Frequency     Barriers to discharge        Co-evaluation               AM-PAC PT "6 Clicks" Mobility  Outcome Measure Help needed turning from your back to your side while in a flat bed without using bedrails?: None Help needed moving from lying on your back to sitting on the side of a flat bed without using bedrails?: None Help needed moving to and from a bed to a chair (including a wheelchair)?: None Help needed standing up from a chair using your arms (e.g., wheelchair or bedside chair)?: None Help needed to walk in hospital room?: None Help needed climbing 3-5 steps with a railing? : None 6 Click Score: 24    End of Session Equipment Utilized During Treatment: Gait belt Activity Tolerance: Patient tolerated treatment well Patient left: in bed;with call bell/phone within reach;with family/visitor present Nurse Communication: Mobility status PT Visit Diagnosis: Difficulty in walking, not elsewhere classified (R26.2)    Time: 1255-1310 PT Time Calculation (min) (ACUTE ONLY): 15 min   Charges:   PT Evaluation $PT Eval Low Complexity: 1 Low          Lillia Pauls, PT, DPT Acute Rehabilitation Services Pager [phone] Office [phone]   Norval Morton 11/27/2020, 1:24 PM

## 2020-11-27 NOTE — Progress Notes (Signed)
Patient ready for discharge to home; discharge instructions given and reviewed; Rx sent electronically.  Reviewed instructions on vitamin D order.  Patient dressed and ready to leave.  Patient discharged out via wheelchair accompanied home by his mother.

## 2020-11-27 NOTE — Progress Notes (Signed)
Neurology Progress Note  Subjective: Mother at bedside during assessment today. Patient with subjective improvement in left facial numbness, visual disturbance (room spinning and intermittent diplopia), and feeling of instability with ambulation. He reports improvement from presentation and even some improvement since examination yesterday. States he is able to walk much better today but prefers to walk more slowly to be safe. He does not endorse lightheadedness or room spinning when going from sitting to standing or with ambulation.   Exam: Vitals:   11/27/20 0800 11/27/20 1255  BP: (!) 149/86 (!) 146/68  Pulse: 71 82  Resp: 18 16  Temp: 98.7 F (37.1 C) 98.4 F (36.9 C)  SpO2: 100% 100%   Gen: Sitting at bedside, awake, in no acute distress Resp: non-labored breathing, regular respirations Abd: soft, non-distended  Neurologic Examination: Mental Status: alert and oriented to person, place, time, and situation. Follows all commands. Speech is intact without dysarthria or aphasia. Comprehension and fluency intact. No neglect noted.  Cranial Nerves: II: Visual Fields are full. Pupils are equal, round, and reactive to light 4 mm/brisk. III,IV, VI: EOMI without ptosis. Right-beating nystagmus noted in all directions of gaze, worse with right lateral gaze.   V: Facial sensation is asymmetric to temperature and light touch with mild decrease in sensation to the left upper cheek and forehead. (Improved numbness from yesterday's exam) VII: Facial movement is symmetric resting and smiling VIII: Hearing is intact to voice X: Palate elevates symmetrically XI: Shoulder shrug is symmetric. XII: Tongue protrudes midline without atrophy or fasciculations.  Motor: Tone is normal. Bulk is normal. 5/5 strength was present in all four extremities, spontaneous antigravity movement present in all extremities without pronator drift.  Sensory: Sensation is symmetric to light touch and temperature in the  arms and legs. 2 point discrimination intact.  Plantars: Toes are downgoing bilaterally.  Cerebellar: FNF and HKS are intact bilaterally Gait: Stable without the use of assistive device.  Pertinent Labs: CBC    Component Value Date/Time   WBC 20.2 (H) 11/26/2020 0541   RBC 4.75 11/26/2020 0541   HGB 14.9 11/26/2020 0541   HCT 44.0 11/26/2020 0541   PLT 266 11/26/2020 0541   MCV 92.6 11/26/2020 0541   MCH 31.4 11/26/2020 0541   MCHC 33.9 11/26/2020 0541   RDW 11.9 11/26/2020 0541   LYMPHSABS 1.1 11/22/2020 1101   MONOABS 0.3 11/22/2020 1101   EOSABS 0.0 11/22/2020 1101   BASOSABS 0.0 11/22/2020 1101   CMP     Component Value Date/Time   NA 140 11/26/2020 0541   K 4.2 11/26/2020 0541   CL 105 11/26/2020 0541   CO2 25 11/26/2020 0541   GLUCOSE 109 (H) 11/26/2020 0541   BUN 17 11/26/2020 0541   CREATININE 0.89 11/26/2020 0541   CREATININE 0.99 11/21/2018 1406   CALCIUM 9.3 11/26/2020 0541   PROT 8.1 11/22/2020 1101   ALBUMIN 4.8 11/22/2020 1101   AST 17 11/22/2020 1101   ALT 19 11/22/2020 1101   ALKPHOS 60 11/22/2020 1101   BILITOT 0.6 11/22/2020 1101   GFRNONAA >60 11/26/2020 0541   Impression: 20 year old male with probable MS exacerbation. He does not have a prior diagnosis of MS. He presented for evaluation of dizziness that had been ongoing since last Saturday 11/19/20.  - Brain imaging shows scattered foci of T2 hyperintensity with postcontrast imaging showing enhancement of a lesion in the cerebral peduncle; the findings are most consistent with multiple sclerosis. MRI C-spine with abnormal T2 signal affecting the cord at C2-3  and C3-4 levels consistent with demyelinating disease but without abnormal contrast enhancement.  - Significant family history of MS according to patient's mother further supports diagnosis. - Anti-MOG and anti-NMO antibodies negative; supportive of multiple sclerosis rather than neuromyelitis optica or anti-MOG antibody related demyelination -  Overall history, symptoms, exam findings and imaging are most consistent with multiple sclerosis exacerbation. Of note, approximately one year ago patient describes an episode of right lower extremity numbess that resolved in 1-2 weeks. Current presentation with left upper facial numbness, visual disturbance with "room spinning" sensation, and disequilibrium with gait instability since 11/19/20.  - Five day course of IV Solu-Medrol complete 11/27/20 - Today the patient endorses subjective improvement in dizziness, left facial numbness, and vision disturbance - B12, ACE levels, SLE panel within normal limits; Vitamin D low (22.49 on 11/23/20)  Recommendations: - Continue Vitamin D supplementation - Follow-up with Dr. Epimenio Foot outpatient in 2-4 weeks  - Per PT note, patient may benefit from OPPT to promote neuro recovery  Lanae Boast, AGACNP-BC [facility] [phone]  Electronically signed: Dr. Caryl Pina

## 2020-11-27 NOTE — Discharge Instructions (Signed)
Multiple Sclerosis Multiple sclerosis (MS) is a disease of the brain, spinal cord, and optic nerves (central nervous system). It causes the body's disease-fighting (immune) system to destroy the protective covering (myelin sheath) around nerves in the brain. When this happens, signals (nerve impulses) going to and from the brain and spinal cord do not get sent properly or may not get sent at all. There are several types of MS:  Relapsing-remitting MS. This is the most common type. This causes sudden attacks of symptoms. After an attack, you may recover completely until the next attack, or some symptoms may remain permanently.  Secondary progressive MS. This usually develops after the onset of relapsing-remitting MS. Similar to relapsing-remitting MS, this type also causes sudden attacks of symptoms. Attacks may be less frequent, but symptoms slowly get worse (progress) over time.  Primary progressive MS. This causes symptoms that steadily progress over time. This type of MS does not cause sudden attacks of symptoms. The age of onset of MS varies, but it often develops between 20-40 years of age. MS is a lifelong (chronic) condition. There is no cure, but treatment can help slow down the progression of the disease. What are the causes? The cause of this condition is not known. What increases the risk? You are more likely to develop this condition if:  You are a woman.  You have a relative with MS. However, the condition is not passed from parent to child (inherited).  You have a lack (deficiency) of vitamin D.  You smoke. MS is more common in the [place] than in the [place]. What are the signs or symptoms? Relapsing-remitting and secondary progressive MS cause symptoms to occur in episodes or attacks that may last weeks to months. There may be long periods between attacks in which there are almost no symptoms. Primary progressive MS causes symptoms to steadily  progress after they develop. Symptoms of MS vary because of the many different ways it affects the central nervous system. The main symptoms include:  Vision problems and eye pain.  Numbness and weakness.  Inability to move your arms, hands, feet, or legs (paralysis).  Balance problems.  Shaking that you cannot control (tremors).  Muscle spasms.  Problems with thinking (cognitive changes). MS can also cause symptoms that are associated with the disease, but are not always the direct result of an MS attack. They may include:  Inability to control urination or bowel movements (incontinence).  Headaches.  Fatigue.  Inability to tolerate heat.  Emotional changes.  Depression.  Pain. How is this diagnosed? This condition is diagnosed based on:  Your symptoms.  A neurological exam. This involves checking central nervous system function, such as nerve function, reflexes, and coordination.  MRIs of the brain and spinal cord.  Lab tests, including a lumbar puncture that tests the fluid that surrounds the brain and spinal cord (cerebrospinal fluid).  Tests to measure the electrical activity of the brain in response to stimulation (evoked potentials). How is this treated? There is no cure for MS, but medicines can help decrease the number and frequency of attacks and help relieve nuisance symptoms. Treatment options may include:  Medicines that reduce the frequency of attacks. These medicines may be given by injection, by mouth (orally), or through an IV.  Medicines that reduce inflammation (steroids). These may provide short-term relief of symptoms.  Medicines to help control pain, depression, fatigue, or incontinence.  Nutritional counseling. Vitamin D supplements, if you have a deficiency.  Using   devices to help you move around (assistive devices), such as braces, a cane, or a walker.  Physical therapy to strengthen and stretch your muscles.  Occupational therapy to  help you with everyday tasks.  Alternative or complementary treatments such as exercise, massage, or acupuncture.   Follow these instructions at home:  Take over-the-counter and prescription medicines only as told by your health care provider.  Do not drive or use heavy machinery while taking prescription pain medicine.  Use assistive devices as recommended by your physical therapist or your health care provider.  Exercise as directed by your health care provider.  Eating healthy can help manage MS symptoms.  Return to your normal activities as told by your health care provider. Ask your health care provider what activities are safe for you.  Reach out for support. Share your feelings with friends, family, or a support group.  Keep all follow-up visits as told by your health care provider and therapists. This is important. Where to find more information  National Multiple Sclerosis Society: https://www.nationalmssociety.org  National Institute of Neurological Disorders and Stroke: https://www.ninds.nih.gov  National Center for Complementary and Integrative Health: https://www.nccih.nih.gov/ Contact a health care provider if:  You feel depressed.  You develop new pain or numbness.  You have tremors.  You have problems with sexual function. Get help right away if:  You develop paralysis.  You develop numbness.  You have problems with your bladder or bowel function.  You develop double vision.  You lose vision in one or both eyes.  You develop suicidal thoughts.  You develop severe confusion. If you ever feel like you may hurt yourself or others, or have thoughts about taking your own life, get help right away. You can go to your nearest emergency department or call:  Your local emergency services (911 in the [place]  A suicide crisis helpline, such as the National Suicide Prevention Lifeline at 1-800-273-8255. This is open 24 hours a day. Summary  Multiple  sclerosis (MS) is a disease of the central nervous system that causes the body's immune system to destroy the protective covering (myelin sheath) around nerves in the brain.  There are 3 types of MS: relapsing-remitting, secondary progressive, and primary progressive. Relapsing-remitting and secondary progressive MS cause symptoms to occur in episodes or attacks that may last weeks to months. Primary progressive MS causes symptoms to steadily progress after they develop.  There is no cure for MS, but medicines can help decrease the number and frequency of attacks and help relieve nuisance symptoms. Treatment may also include physical or occupational therapy.  If you develop numbness, paralysis, vision problems, or other neurological symptoms, get help right away. This information is not intended to replace advice given to you by your health care provider. Make sure you discuss any questions you have with your health care provider. Document Revised: 07/12/2020 Document Reviewed: 07/12/2020 Elsevier Patient Education  2021 Elsevier Inc.  

## 2020-11-28 NOTE — Discharge Summary (Signed)
[facility] Discharge Summary   Patient: Sean Fox. ZOX:[id number]  PCP: Ardith Dark, MD  Date of admission: 11/23/2020   Date of discharge: 11/27/2020     Discharge Diagnoses:  Principal Problem:   Multiple sclerosis (HCC) Active Problems:   Essential hypertension   Vertigo   Admitted From: home Disposition:  Home   Recommendations for Outpatient Follow-up:  1. PCP: please follow up with PCP and Neurology  2. Follow up LABS/TEST:  nne   Follow-up Information    Ardith Dark, MD. Schedule an appointment as soon as possible for a visit in 1 week(s).   Specialty: Family Medicine Contact information: [address] [place] [address] [phone]        Asa Lente, MD. Schedule an appointment as soon as possible for a visit in 2 month(s).   Specialty: Neurology Contact information: [address] [place] [address] [phone]              Discharge Instructions    Ambulatory referral to Neurology   Complete by: As directed    An appointment is requested in approximately: 2 - 4 weeks for evaluation of probable Multiple Sclerosis, previously undiagnosed.   Ambulatory referral to Physical Therapy   Complete by: As directed    Diet - low sodium heart healthy   Complete by: As directed    Diet - low sodium heart healthy   Complete by: As directed    Discharge instructions   Complete by: As directed    It is important that you read the instructions as well as go over your medication list with RN to help you understand your care after this hospitalization.  Please follow-up with PCP in 1-2 weeks.  Please note that we are unable to authorize any refills for discharge medications, once you are discharged. Thus, it is imperative that you return to your primary care physician (or establish a relationship with a primary care physician if you do not have one) for your care needs. So that they can reassess your need for medications and monitor  your lab values.  Please request your primary care physician to go over all Hospital Tests and Procedure/Radiological results at the follow up. Please get all Hospital records sent to your PCP by signing hospital release before you go home.   Do not drive, operating heavy machinery, perform activities at heights, swimming or participation in water activities or provide baby sitting services; until you have been seen by Primary Care Physician and are cleared to do such activities.  Do not take more than prescribed Pain, Sleep and Anxiety Medications.  You were cared for by a hospitalist during your hospital stay. If you have any questions about your discharge medications or the care you received while you were in the hospital after you are discharged, you can call the hospital unit/floor you were admitted to and ask to speak with the hospitalist who took care of you. Ask for Hospitalist on call, if the hospitalist that took care of you is not available. Once you are discharged, your primary care physician will help you with any further medical issues. You Must read complete instructions/literature along with all the possible adverse reactions/side effects for all the Medicines you take and that have been prescribed to you. Take any new Medicines after you have completely understood and accept all the possible adverse reactions/side effects. If you have smoked or chewed Tobacco, please STOP smoking. If you drink alcohol, please safely STOP  the use. Do not drive, operating heavy machinery, perform activities at heights, swimming or participation in water activities or provide baby sitting services under influence.   Increase activity slowly   Complete by: As directed    Increase activity slowly   Complete by: As directed       Diet recommendation: Regular diet  Activity: The patient is advised to gradually reintroduce usual activities, as tolerated  Discharge Condition: stable  Code Status:  Full code   History of present illness: As per the H and P dictated on admission, ": [facility]. is a 20 y.o. male with medical history significant for HTN has had several days of dizziness which he describes as the room spinning. He also reports left face paresthesias and ringing in left ear. No numbness or weakness in extremities. Seen at [facility] for same symptoms yesterday and felt to have peripheral vertigo and he was discharged with Rx for Meclizine. CBC and CMP were normal. He went to his PCP today in follow up and was noted to have continued horizontal nystagmus so he was sent for an outpatient MRI which showed lesions concerning for MS. He denies any current weakness but says that he feels some tingling and paresthesias on the left face. When asked about any prior episodes of visual symptoms, he says that he cannot remember exactly when it happened but has had previous visual changes that that lasted a few days to a week before they got better. He does not remember whether it was painful or painless. Mother was at bedside and reported that she has multiple family members including brother, niece and grandparents who had MS and one of the family members-the mother's brother, passed away due to complications of MS at a very young age in his 44s. Does not describe worsening of symptoms with heat. Does not describe any electric shocklike sensation down the spine."  Hospital Course:  Summary of his active problems in the hospital is as following. 1.  Demyelinating disease of the brain with active demyelination seen on MRI. Likely multiple sclerosis Neurology consulted. MRI brain concerning for demyelinating lesions. MRI C-spine shows demyelinating lesion as well. Currently started on IV Solu-Medrol 1 g daily for 5 days.  Day 4 today. Patient reports mild improvement in his symptoms. Other work-up anti-MOG, antianimal antibodies negative, B12, ACE level, SLE panel within normal limits, vitamin D  level low currently being replaced. Tolerating p.o. diet. No other acute complaints.  Continues to have some numbness but gradually improving as well as dizziness. Monitor on neuro checks. Outpatient follow-up with Dr. Epimenio Foot  2.  Essential hypertension Blood pressure mildly elevated. Discontinue both home antihypertensive medication.  Patient was also given IV fluid.   3.  Vertigo Dysmetria Related to his MS presentation.   Improving.  As needed meclizine.  4.  Vitamin D deficiency. On 50,000 weekly dose.  Patient was seen by physical therapy, who recommended outpatient PT. On the day of the discharge the patient's vitals were stable, and no other acute medical condition were reported by patient. The patient was felt safe to be discharge at Home with outpatient PT.  Consultants: Neurology  Procedures: none  DISCHARGE MEDICATION: Allergies as of 11/27/2020      Reactions   Gramineae Pollens    Mold Extract [trichophyton]       Medication List    STOP taking these medications   Cholecalciferol 25 MCG (1000 UT) tablet   lisinopril 5 MG tablet Commonly known as: ZESTRIL  TAKE these medications   acetaminophen 325 MG tablet Commonly known as: TYLENOL Take 650 mg by mouth every 6 (six) hours as needed for mild pain, fever or headache.   cetirizine 10 MG tablet Commonly known as: ZYRTEC Take 10 mg by mouth daily.   meclizine 25 MG tablet Commonly known as: ANTIVERT Take 1 tablet (25 mg total) by mouth 3 (three) times daily as needed for dizziness.   multivitamin with minerals Tabs tablet Take 1 tablet by mouth daily.   ondansetron 8 MG disintegrating tablet Commonly known as: Zofran ODT Take 1 tablet (8 mg total) by mouth every 8 (eight) hours as needed for nausea or vomiting.   SUPER B COMPLEX/C PO Take 1 tablet by mouth daily.   Vitamin D (Ergocalciferol) 1.25 MG (50000 UNIT) Caps capsule Commonly known as: DRISDOL Take 1 capsule (50,000 Units total)  by mouth every 7 (seven) days. Start taking on: December 01, 2020       Discharge Exam: Ceasar Mons Weights   11/24/20 1549 11/25/20 1706  Weight: 79.4 kg 82.7 kg   Vitals:   11/27/20 0800 11/27/20 1255  BP: (!) 149/86 (!) 146/68  Pulse: 71 82  Resp: 18 16  Temp: 98.7 F (37.1 C) 98.4 F (36.9 C)  SpO2: 100% 100%   General: Appear in no distress, n Rash; Oral Mucosa Clear, moist. no Abnormal Neck Mass Or lumps, Conjunctiva normal  Cardiovascular: S1 and S2 Present, no Murmur Respiratory: good respiratory effort, Bilateral Air entry present and CTA, no Crackles, no wheezes Abdomen: Bowel Sound present, Soft and no tenderness Extremities: no Pedal edema Neurology: alert and oriented to time, place, and person affect appropriate. no new focal deficit, mild left facial numbness and left sided dysmetria   The results of significant diagnostics from this hospitalization (including imaging, microbiology, ancillary and laboratory) are listed below for reference.    Significant Diagnostic Studies: MR Brain Wo Contrast  Result Date: 11/23/2020 CLINICAL DATA:  Horizontal nystagmus, vertigo, paresthesia, dysgeusia. EXAM: MRI HEAD WITHOUT CONTRAST TECHNIQUE: Multiplanar, multiecho pulse sequences of the brain and surrounding structures were obtained without intravenous contrast. COMPARISON:  None. FINDINGS: Brain: No acute infarction, hemorrhage, hydrocephalus, extra-axial collection or mass lesion. Scattered foci of T2 hyperintensity are seen within the white matter of the cerebral hemispheres, including deep, juxta cortical, periventricular white matter and corpus callosum, as well as left cerebral peduncle, left side of the pons and bilateral middle cerebellar peduncles and cerebellar hemispheres. Vascular: Normal flow voids. Skull and upper cervical spine: Normal marrow signal. Sinuses/Orbits: Negative. IMPRESSION: Scattered foci of T2 hyperintensity within the white matter of the cerebral  hemispheres, including deep, juxta cortical, periventricular white matter and corpus callosum, as well as left cerebral peduncle, left side of the pons and bilateral middle cerebellar peduncles and cerebellar hemispheres. While nonspecific, findings are highly concerning for demyelinating disease. Electronically Signed   By: Baldemar Lenis M.D.   On: 11/23/2020 13:15   MR BRAIN W CONTRAST  Result Date: 11/23/2020 CLINICAL DATA:  Dizziness. No contrast MRI suggestive of demyelinating disease. EXAM: MRI HEAD WITH CONTRAST TECHNIQUE: Multiplanar, multiecho pulse sequences of the brain and surrounding structures were obtained with intravenous contrast. CONTRAST:  70mL GADAVIST GADOBUTROL 1 MMOL/ML IV SOLN COMPARISON:  MRI of the brain November 23, 2018 at 12:41 p.m. FINDINGS: 3D FLAIR images again demonstrate multiple supratentorial and infratentorial hyperintense lesions concerning for demyelinating plaques. Postcontrast images show contrast enhancement associated with a large plaque within the left middle cerebellar peduncle, consistent  with active demyelination. No other focal is of abnormal contrast enhancement. IMPRESSION: Contrast enhancement associated with a large plaque within the left middle cerebellar peduncle, consistent with active demyelination. Electronically Signed   By: Baldemar LenisKatyucia  De Macedo Rodrigues M.D.   On: 11/23/2020 21:27   MR CERVICAL SPINE W WO CONTRAST  Result Date: 11/24/2020 CLINICAL DATA:  Demyelinating disease EXAM: MRI CERVICAL SPINE WITHOUT AND WITH CONTRAST TECHNIQUE: Multiplanar and multiecho pulse sequences of the cervical spine, to include the craniocervical junction and cervicothoracic junction, were obtained without and with intravenous contrast. CONTRAST:  8mL GADAVIST GADOBUTROL 1 MMOL/ML IV SOLN COMPARISON:  Brain studies done yesterday. 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 Cord: Abnormal T2 signal within the cord  at the C2-3 level and the C3-4 level consistent with MS plaques. No abnormal contrast enhancement. Posterior Fossa, vertebral arteries, paraspinal tissues: See results of brain MRI. 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 Disc levels: No disc abnormality at C2-3. C3-4: Mild bulging of the disc.  No compressive stenosis. C4-5: No disc pathology.  No stenosis. C5-6: Mild bulging of the disc.  No compressive stenosis. C6-7: Congenital failure of segmentation. Wide patency of the canal and foramina. C7-T1: Mild uncovertebral prominence on the left. No compressive canal or foraminal narrowing. T1-2: Normal IMPRESSION: 1. Abnormal T2 signal affecting the cord at the C2-3 and C3-4 levels consistent with demyelinating disease. No abnormal contrast enhancement. 2. 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 3. Mild non-compressive disc bulges at C3-4 and C5-6. 4. Mild left uncovertebral prominence at C7-T1 without compressive stenosis. Electronically Signed   By: Paulina FusiMark  Shogry M.D.   On: 11/24/2020 22:46    Microbiology: Recent Results (from the past 240 hour(s))  Resp Panel by RT-PCR (Flu A&B, Covid) Nasopharyngeal Swab     Status: None   Collection Time: 11/23/20  9:40 PM   Specimen: Nasopharyngeal Swab; Nasopharyngeal(NP) swabs in vial transport medium  Result Value Ref Range Status   SARS Coronavirus 2 by RT PCR NEGATIVE NEGATIVE Final    Comment: (NOTE) SARS-CoV-2 target nucleic acids are NOT DETECTED.  The SARS-CoV-2 RNA is generally detectable in upper respiratory specimens during the acute phase of infection. The lowest concentration of SARS-CoV-2 viral copies this assay can detect is 138 copies/mL. A negative result does not preclude SARS-Cov-2 infection and should not be used as the sole basis for treatment or other patient management decisions. A negative result may occur with  improper specimen collection/handling, submission of specimen other than nasopharyngeal swab,  presence of viral mutation(s) within the areas targeted by this assay, and inadequate number of viral copies(<138 copies/mL). A negative result must be combined with clinical observations, patient history, and epidemiological information. The expected result is Negative.  Fact Sheet for Patients:  BloggerCourse.comhttps://www.fda.gov/media/152166/download  Fact Sheet for Healthcare Providers:  SeriousBroker.ithttps://www.fda.gov/media/152162/download  This test is no t yet approved or cleared by the [place] FDA and  has been authorized for detection and/or diagnosis of SARS-CoV-2 by FDA under an Emergency Use Authorization (EUA). This EUA will remain  in effect (meaning this test can be used) for the duration of the COVID-19 declaration under Section 564(b)(1) of the Act, 21 U.S.C.section 360bbb-3(b)(1), unless the authorization is terminated  or revoked sooner.       Influenza A by PCR NEGATIVE NEGATIVE Final   Influenza B by PCR NEGATIVE NEGATIVE Final    Comment: (NOTE) The Xpert Xpress SARS-CoV-2/FLU/RSV plus assay is intended as an aid in the diagnosis of influenza  from Nasopharyngeal swab specimens and should not be used as a sole basis for treatment. Nasal washings and aspirates are unacceptable for Xpert Xpress SARS-CoV-2/FLU/RSV testing.  Fact Sheet for Patients: BloggerCourse.com  Fact Sheet for Healthcare Providers: SeriousBroker.it  This test is not yet approved or cleared by the [place] FDA and has been authorized for detection and/or diagnosis of SARS-CoV-2 by FDA under an Emergency Use Authorization (EUA). This EUA will remain in effect (meaning this test can be used) for the duration of the COVID-19 declaration under Section 564(b)(1) of the Act, 21 U.S.C. section 360bbb-3(b)(1), unless the authorization is terminated or revoked.  Performed at [facility], [address], [place], [place] [address]       Labs: CBC: Recent Labs  Lab 11/22/20 1101 11/24/20 0500 11/26/20 0541  WBC 6.2 6.7 20.2*  NEUTROABS 4.7  --   --   HGB 15.4 15.7 14.9  HCT 45.9 48.0 44.0  MCV 94.4 93.8 92.6  PLT 263 277 266   Basic Metabolic Panel: Recent Labs  Lab 11/22/20 1101 11/24/20 0500 11/26/20 0541  NA 140 140 140  K 4.4 4.1 4.2  CL 105 105 105  CO2 27 24 25   GLUCOSE 99 126* 109*  BUN 14 14 17   CREATININE 0.78 1.00 0.89  CALCIUM 9.7 9.7 9.3   Liver Function Tests: Recent Labs  Lab 11/22/20 1101  AST 17  ALT 19  ALKPHOS 60  BILITOT 0.6  PROT 8.1  ALBUMIN 4.8   CBG: No results for input(s): GLUCAP in the last 168 hours.  Time spent: 35 minutes  Signed:   [facility] 11/27/2020 7:56 AM

## 2020-12-01 ENCOUNTER — Other Ambulatory Visit: Payer: Self-pay

## 2020-12-01 ENCOUNTER — Ambulatory Visit: Payer: BC Managed Care – PPO | Admitting: Family Medicine

## 2020-12-01 ENCOUNTER — Encounter: Payer: Self-pay | Admitting: Family Medicine

## 2020-12-01 DIAGNOSIS — G35 Multiple sclerosis: Secondary | ICD-10-CM

## 2020-12-01 DIAGNOSIS — I10 Essential (primary) hypertension: Secondary | ICD-10-CM | POA: Diagnosis not present

## 2020-12-01 NOTE — Assessment & Plan Note (Signed)
Doing much better.  Still has a bit of residual vertigo and brain fog.  Hopefully this will continue to improve over the next several weeks.  He will be following up with neurology in 11 days.  Discussed reasons to return to care.

## 2020-12-01 NOTE — Assessment & Plan Note (Signed)
At goal on lisinopril 5 mg daily. 

## 2020-12-01 NOTE — Patient Instructions (Signed)
It was very nice to see you today!  I am glad that you are feeling better.  Please send any paperwork that you need.  Please follow-up with neurology soon.  I would like to see you back in about 6 months or so for your annual checkup.  Please come back to see me sooner if needed.  Take care, Dr Jimmey Ralph  Please try these tips to maintain a healthy lifestyle:   Eat at least 3 REAL meals and 1-2 snacks per day.  Aim for no more than 5 hours between eating.  If you eat breakfast, please do so within one hour of getting up.    Each meal should contain half fruits/vegetables, one quarter protein, and one quarter carbs (no bigger than a computer mouse)   Cut down on sweet beverages. This includes juice, soda, and sweet tea.     Drink at least 1 glass of water with each meal and aim for at least 8 glasses per day   Exercise at least 150 minutes every week.

## 2020-12-01 NOTE — Progress Notes (Signed)
   [address] Sean Fox. is a 20 y.o. male who presents today for an office visit.  Assessment/Plan:  Chronic Problems Addressed Today: Multiple sclerosis (HCC) Doing much better.  Still has a bit of residual vertigo and brain fog.  Hopefully this will continue to improve over the next several weeks.  He will be following up with neurology in 11 days.  Discussed reasons to return to care.  Essential hypertension At goal on lisinopril 5 mg daily.  He will follow up in about 6 months or so for CPE.     Subjective:  HPI:  Patient here for hospital follow-up.  We saw him here on 11/23/2020.  He was having neurologic symptoms at that time including vertigo and nystagmus.  MRI showed new onset demyelinating disease.  He was admitted to the hospital that same day for presumed multiple sclerosis.  He was given 5 days of high-dose IV steroids.  Neurology was consulted.  He had additional imaging including C-spine which showed demyelinating lesion.  Additional labs were negative.  Symptoms improved and he was discharged home on hospital day 5.       Objective:  Physical Exam: BP 122/78   Pulse 86   Temp 98.3 F (36.8 C) (Temporal)   Ht 5\' 6"  (1.676 m)   Wt 190 lb 3.2 oz (86.3 kg)   SpO2 97%   BMI 30.70 kg/m   Gen: No acute distress, resting comfortably CV: Regular rate and rhythm with no murmurs appreciated Pulm: Normal work of breathing, clear to auscultation bilaterally with no crackles, wheezes, or rhonchi Neuro: Grossly normal, moves all extremities Psych: Normal affect and thought content  Time Spent: 45 minutes of total time was spent on the date of the encounter performing the following actions: chart review prior to seeing the patient, obtaining history, performing a medically necessary exam, counseling on the treatment plan, placing orders, and documenting in our EHR.        . Katina Degree, MD 12/01/2020 2:28 PM

## 2020-12-05 ENCOUNTER — Encounter: Payer: Self-pay | Admitting: Family Medicine

## 2020-12-06 ENCOUNTER — Inpatient Hospital Stay: Payer: BC Managed Care – PPO | Admitting: Family Medicine

## 2020-12-06 NOTE — Telephone Encounter (Signed)
Form printed

## 2020-12-12 ENCOUNTER — Encounter: Payer: Self-pay | Admitting: Neurology

## 2020-12-12 ENCOUNTER — Telehealth: Payer: Self-pay | Admitting: *Deleted

## 2020-12-12 ENCOUNTER — Ambulatory Visit (INDEPENDENT_AMBULATORY_CARE_PROVIDER_SITE_OTHER): Payer: BC Managed Care – PPO | Admitting: Neurology

## 2020-12-12 VITALS — BP 133/73 | HR 62 | Ht 66.0 in | Wt 188.5 lb

## 2020-12-12 DIAGNOSIS — H532 Diplopia: Secondary | ICD-10-CM | POA: Insufficient documentation

## 2020-12-12 DIAGNOSIS — F320 Major depressive disorder, single episode, mild: Secondary | ICD-10-CM | POA: Insufficient documentation

## 2020-12-12 DIAGNOSIS — R42 Dizziness and giddiness: Secondary | ICD-10-CM

## 2020-12-12 DIAGNOSIS — Z79899 Other long term (current) drug therapy: Secondary | ICD-10-CM | POA: Diagnosis not present

## 2020-12-12 DIAGNOSIS — G35 Multiple sclerosis: Secondary | ICD-10-CM

## 2020-12-12 DIAGNOSIS — F32A Depression, unspecified: Secondary | ICD-10-CM | POA: Insufficient documentation

## 2020-12-12 DIAGNOSIS — R260 Ataxic gait: Secondary | ICD-10-CM | POA: Insufficient documentation

## 2020-12-12 NOTE — Telephone Encounter (Signed)
Placed [facility] lab in [facility] lock box for routine lab pick up. Results pending. 

## 2020-12-12 NOTE — Progress Notes (Signed)
[facility]  PATIENT: Sean Fox. DOB: Dec 29, 2000  REFERRING DOCTOR OR PCP:  Jacquiline Doe, MD / Lanae Boast, NP SOURCE: Patient, notes from recent hospital admission, imaging and lab results, MRI images personally reviewed.  _________________________________   HISTORICAL  CHIEF COMPLAINT:  Chief Complaint  Patient presents with  . New Patient (Initial Visit)    RM 79 with mother. New patient for MS. At [facility] 11/23/20-11/27/20. Presented w/ double vision/dizziness/decreased taste/"funny feeling on L side of face". Mom's brother, niece, grandparents have MS. Received IV steroids x5 days while at hospital. Sx have improved some since being discharged.Still feeling off balance/disoriented. Hard to concentrate/"spaces out". No vision issues.    HISTORY OF PRESENT ILLNESS:  I had the pleasure of seeing your patient, Sean, Fox., at the [facility] at [facility] for a new diagnosis of multiple sclerosis.  He is a 20 year old man who presented to [facility] 11/23/2020 with double vision, nystagmus, dizziness, altered taste and left facial paresthesias.  Symptoms started with a headache 11/19/20 and he woke up with dizziness the next day 11/20/2020.  The next day he had diplopia and nystagmus and saw urgent care.   The next day 11/22/2020 he also had nausea, vomiting.   His PCP ordered an MRI that was performed 11/23/2020.  It showed multiple lesions c/w MS in supratentoiral and infratentorial white matter. and he was sent to the ED.   Further MRI showed an enhancing lesion in the left pons/middle cerebellar peduncle.   MRI cervical spine showed 2 more lesions.  He was admitted and received 5 days of IV Slu-medrol l with improvement of the symptoms  In retrospect, 3 years ago, he had numbness in the left leg with some ataxia lasting 2-3 weeks.   He notes mild visual blurring at times and was told he has mild astigmatism.     Currently, he feels better.   Diplopia and nausea have resolved.   He still feels mildly off balanced but clearly better than a few weeks ago.   He denies numbness or weakness.   Bladder function is fine.    Cognitively, he notes fatigue and decreased focus/attention.  He has word finding problems.     He feels some depression/anxiety since his diagnosis.  There is a family history of multiple sclerosis involving a maternal uncle (had severe neurologic issues starting at age 80's but not seen; became bed-bound and died), cousin (diagnosed around age 1 and doing well) and probably grandfather (progressive neurologic issues affecting gait).    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   He is currently on medical leave.  Imaging studies personally reviewed: MRI of the brain 11/23/2020 showed multiple T2/FLAIR hyperintense foci in the hemispheres predominantly in the periventricular white matter but also in the juxtacortical and deep white matter.  Foci were also present in the left middle cerebral peduncle, right middle cerebellar peduncle, left middle cerebellar peduncle and right cerebellar hemisphere..   The large focus in the left middle cerebellar peduncle enhanced after contrast.  None of the other foci enhanced.  MRI of the cervical spine 11/24/2020 showed posterior lesions at C2-C3 and C3-C4 consistent with demyelination.  They did not enhance.  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  There are mild degenerative changes at C3-C4, C5-C6 and C7-T1.  Laboratory tests: NMO Ab, HIV, B12, anti-Jo1, ENA, ACE, CBC/diff and CMP were negative or unremarkable. Vitamin D was reduced at 22.49  REVIEW OF  SYSTEMS: Constitutional: No fevers, chills, sweats, or change in appetite Eyes: No visual changes, double vision, eye pain Ear, nose and throat: No hearing loss, ear pain, nasal congestion, sore throat Cardiovascular: No chest pain, palpitations Respiratory: No shortness of breath at rest or with exertion.   No  wheezes GastrointestinaI: No nausea, vomiting, diarrhea, abdominal pain, fecal incontinence Genitourinary: No dysuria, urinary retention or frequency.  No nocturia. Musculoskeletal: No neck pain, back pain Integumentary: No rash, pruritus, skin lesions Neurological: as above Psychiatric: No depression at this time.  No anxiety Endocrine: No palpitations, diaphoresis, change in appetite, change in weigh or increased thirst Hematologic/Lymphatic: No anemia, purpura, petechiae. Allergic/Immunologic: No itchy/runny eyes, nasal congestion, recent allergic reactions, rashes  ALLERGIES: Allergies  Allergen Reactions  . Gramineae Pollens   . Mold Extract [Trichophyton]     HOME MEDICATIONS:  Current Outpatient Medications:  .  acetaminophen (TYLENOL) 325 MG tablet, Take 650 mg by mouth every 6 (six) hours as needed for mild pain, fever or headache., Disp: , Rfl:  .  cetirizine (ZYRTEC) 10 MG tablet, Take 10 mg by mouth daily., Disp: , Rfl: 6 .  meclizine (ANTIVERT) 25 MG tablet, Take 1 tablet (25 mg total) by mouth 3 (three) times daily as needed for dizziness., Disp: 15 tablet, Rfl: 0 .  ondansetron (ZOFRAN ODT) 8 MG disintegrating tablet, Take 1 tablet (8 mg total) by mouth every 8 (eight) hours as needed for nausea or vomiting., Disp: 20 tablet, Rfl: 0 .  SUPER B COMPLEX/C PO, Take 1 tablet by mouth daily., Disp: , Rfl:  .  Vitamin D, Ergocalciferol, (DRISDOL) 1.25 MG (50000 UNIT) CAPS capsule, Take 1 capsule (50,000 Units total) by mouth every 7 (seven) days., Disp: 5 capsule, Rfl: 0 .  Multiple Vitamin (MULTIVITAMIN WITH MINERALS) TABS tablet, Take 1 tablet by mouth daily., Disp: , Rfl:   PAST MEDICAL HISTORY: Past Medical History:  Diagnosis Date  . Eczema   . Hypertension     PAST SURGICAL HISTORY: Past Surgical History:  Procedure Laterality Date  . NO PAST SURGERIES      FAMILY HISTORY: Family History  Problem Relation Age of Onset  . Hypertension Mother   .  Hypertension Father   . Lupus Sister   . Multiple sclerosis Maternal Uncle   . Migraines Neg Hx   . Seizures Neg Hx   . Autism Neg Hx   . ADD / ADHD Neg Hx   . Anxiety disorder Neg Hx   . Depression Neg Hx   . Bipolar disorder Neg Hx   . Schizophrenia Neg Hx     SOCIAL HISTORY:  Social History   Socioeconomic History  . Marital status: Single    Spouse name: Not on file  . Number of children: 0  . Years of education: 12+  . Highest education level: Not on file  Occupational History  . Occupation: [employer], attending college  Tobacco Use  . Smoking status: Never Smoker  . Smokeless tobacco: Never Used  Vaping Use  . Vaping Use: Never used  Substance and Sexual Activity  . Alcohol use: Never  . Drug use: Never  . Sexual activity: Not on file  Other Topics Concern  . Not on file  Social History Narrative   Left handed   Caffeine use: 3-4 cups coffee per week   Lives with mom dad and sister visits she is in college. He takes online college courses   Social Determinants of Corporate investment banker Strain:  Not on file  Food Insecurity: Not on file  Transportation Needs: Not on file  Physical Activity: Not on file  Stress: Not on file  Social Connections: Not on file  Intimate Partner Violence: Not on file     PHYSICAL EXAM  Vitals:   12/12/20 0850  BP: 133/73  Pulse: 62  Weight: 188 lb 8 oz (85.5 kg)  Height: 5\' 6"  (1.676 m)    Body mass index is 30.42 kg/m.    Hearing Screening   125Hz  250Hz  500Hz  1000Hz  2000Hz  3000Hz  4000Hz  6000Hz  8000Hz   Right ear:           Left ear:             Visual Acuity Screening   Right eye Left eye Both eyes  Without correction: 20/40 20/30 20/30   With correction:         General: The patient is well-developed and well-nourished and in no acute distress  HEENT:  Head is [place]/AT.  Sclera are anicteric.  Funduscopic exam shows normal optic discs and retinal vessels.  Neck: No carotid bruits are noted.  The neck  is nontender.  Cardiovascular: The heart has a regular rate and rhythm with a normal S1 and S2. There were no murmurs, gallops or rubs.    Skin: Extremities are without rash or  edema.  Musculoskeletal:  Back is nontender  Neurologic Exam  Mental status: The patient is alert and oriented x 3 at the time of the examination. The patient has apparent normal recent and remote memory, with an apparently normal attention span and concentration ability.   Speech is normal.  Cranial nerves: Extraocular movements are full. Pupils are equal, round, and reactive to light and accomodation.  Visual fields are full.  Facial symmetry is present. There is good facial sensation to soft touch bilaterally.Facial strength is normal.  Trapezius and sternocleidomastoid strength is normal. No dysarthria is noted.  The tongue is midline, and the patient has symmetric elevation of the soft palate. No obvious hearing deficits are noted.  Motor:  Muscle bulk is normal.   Tone is normal. Strength is  5 / 5 in all 4 extremities.   Sensory: Sensory testing is intact to pinprick, soft touch and vibration sensation in all 4 extremities.  Coordination: Cerebellar testing reveals good finger-nose-finger and heel-to-shin bilaterally.  Gait and station: Station is normal.   Gait is normal. Tandem gait is wide. Romberg is negative.   Reflexes: Deep tendon reflexes are symmetric and normal bilaterally.   Plantar responses are flexor.    DIAGNOSTIC DATA (LABS, IMAGING, TESTING) - I reviewed patient records, labs, notes, testing and imaging myself where available.  Lab Results  Component Value Date   WBC 20.2 (H) 11/26/2020   HGB 14.9 11/26/2020   HCT 44.0 11/26/2020   MCV 92.6 11/26/2020   PLT 266 11/26/2020      Component Value Date/Time   NA 140 11/26/2020 0541   K 4.2 11/26/2020 0541   CL 105 11/26/2020 0541   CO2 25 11/26/2020 0541   GLUCOSE 109 (H) 11/26/2020 0541   BUN 17 11/26/2020 0541   CREATININE  0.89 11/26/2020 0541   CREATININE 0.99 11/21/2018 1406   CALCIUM 9.3 11/26/2020 0541   PROT 8.1 11/22/2020 1101   ALBUMIN 4.8 11/22/2020 1101   AST 17 11/22/2020 1101   ALT 19 11/22/2020 1101   ALKPHOS 60 11/22/2020 1101   BILITOT 0.6 11/22/2020 1101   GFRNONAA >60 11/26/2020 0541   No results  found for: CHOL, HDL, LDLCALC, LDLDIRECT, TRIG, CHOLHDL Lab Results  Component Value Date   HGBA1C 5.5 11/21/2018   Lab Results  Component Value Date   VITAMINB12 747 11/23/2020   Lab Results  Component Value Date   TSH 0.97 11/21/2018       ASSESSMENT AND PLAN  Multiple sclerosis (HCC) - Plan: QuantiFERON-TB Gold Plus, Stratify JCV Antibody Test ([facility]), Hepatitis B surface antigen, Hepatitis B surface antibody,qualitative, Hepatitis B core antibody, total, Varicella zoster antibody, IgG, IgG, IgA, IgM  High risk medication use - Plan: QuantiFERON-TB Gold Plus, Stratify JCV Antibody Test ([facility]), Hepatitis B surface antigen, Hepatitis B surface antibody,qualitative, Hepatitis B core antibody, total, Varicella zoster antibody, IgG, IgG, IgA, IgM  Vertigo  Diplopia  Ataxic gait  Mild depression (HCC)   In summary, Mr. Margo AyeHall is a 20 year old young man with a recent diagnosis of multiple sclerosis. He has a more aggressive than average presentation. Specifically, he is presenting with a brainstem syndrome and has several other infratentorial and spinal cord lesions. Because of this more aggressive presentation we need to consider a highly effective disease modifying therapy as his initial treatment. I discussed options and recommend that we decide between Tysabri and Ocrevus as they offer excellent ability to reduce new lesions and relapses and delay progression of disability. I went over the risks and benefits of both drugs. We will check lab work for both of these agents including the JCV antibody that can help to determine if he would be at higher risk of PML infections if he is placed  on Tysabri. We will let him know the results of the studies when they return and I gave him additional information about the medications. He will continue to take the vitamin D. He is advised to remain active and eat a heart healthy diet. If the depression worsens, we will consider treatment.  He will return to see me in 3 months but return to the infusion center after we get the results of the blood work and gain authorization from insurance.  Thank you for asking me to see Mr. Margo AyeHall. Please let me know if I can be of further assistance with him or other patients in the future.  Richard A. Epimenio FootSater, MD, [facility],FAAN 12/12/2020, 8:04 PM Certified in Neurology, Clinical Neurophysiology, Sleep Medicine and Neuroimaging  [facility] [address], [address] [place], [place] [address] [phone]

## 2020-12-16 ENCOUNTER — Ambulatory Visit: Payer: BC Managed Care – PPO | Attending: Family Medicine | Admitting: Physical Therapy

## 2020-12-16 ENCOUNTER — Other Ambulatory Visit: Payer: Self-pay

## 2020-12-16 DIAGNOSIS — R2681 Unsteadiness on feet: Secondary | ICD-10-CM | POA: Diagnosis not present

## 2020-12-16 DIAGNOSIS — R2689 Other abnormalities of gait and mobility: Secondary | ICD-10-CM | POA: Insufficient documentation

## 2020-12-16 LAB — QUANTIFERON-TB GOLD PLUS
QuantiFERON Mitogen Value: >10 IU/mL
QuantiFERON Nil Value: 0.02 IU/mL
QuantiFERON TB1 Ag Value: 0.02 IU/mL
QuantiFERON TB2 Ag Value: 0.01 IU/mL
QuantiFERON-TB Gold Plus: NEGATIVE

## 2020-12-16 LAB — IGG, IGA, IGM
IgA/Immunoglobulin A, Serum: 212 mg/dL (ref 90–386)
IgG (Immunoglobin G), Serum: 1025 mg/dL (ref 671–1456)
IgM (Immunoglobulin M), Srm: 31 mg/dL — ABNORMAL LOW (ref 35–168)

## 2020-12-16 LAB — HEPATITIS B SURFACE ANTIBODY,QUALITATIVE: Hep B Surface Ab, Qual: NONREACTIVE

## 2020-12-16 LAB — HEPATITIS B CORE ANTIBODY, TOTAL: Hep B Core Total Ab: NEGATIVE

## 2020-12-16 LAB — VARICELLA ZOSTER ANTIBODY, IGG: Varicella zoster IgG: 342 index (ref >165)

## 2020-12-16 LAB — HEPATITIS B SURFACE ANTIGEN: Hepatitis B Surface Ag: NEGATIVE

## 2020-12-16 NOTE — Therapy (Signed)
[facility] [address] [place], [place], [address] Phone: [phone]   Fax:  [phone]  Physical Therapy Evaluation  Patient Details  Name: Sean Fox. MRN: [id number] Date of Birth: Nov 26, 2000 Referring Provider (PT): Despina Arias, MD   Encounter Date: 12/16/2020   PT End of Session - 12/16/20 1023    Visit Number 1    Number of Visits 9    Authorization Type BCBS    PT Start Time 1022    PT Stop Time 1100    PT Time Calculation (min) 38 min    Activity Tolerance Patient tolerated treatment well    Behavior During Therapy WFL for tasks assessed/performed           Past Medical History:  Diagnosis Date  . Eczema   . Hypertension     Past Surgical History:  Procedure Laterality Date  . NO PAST SURGERIES      There were no vitals filed for this visit.    Subjective Assessment - 12/16/20 1025    Subjective A few weeks ago had a headache, then woke up with everything spinning.  Couldn't do anything.  Went to ED, twice in two days, due to the vertigo, nausea; then stayed in hospital for several days.  Not sure what PT can help me with.  Don't feel completely balanced, but I haven't had any falls.  The vertigo and dizziness is better.    Patient Stated Goals Pt's goal for therapy:  unsure-don't know about physical therapy and what that will do for me.  Would be nice to get back into workout routine    Currently in Pain? No/denies              [facility] PT Assessment - 12/16/20 1031      Assessment   Medical Diagnosis MS    Referring Provider (PT) Despina Arias, MD    Onset Date/Surgical Date 11/27/20    Hand Dominance Left      Precautions   Precautions Fall      Balance Screen   Has the patient fallen in the past 6 months No    Has the patient had a decrease in activity level because of a fear of falling?  No    Is the patient reluctant to leave their home because of a fear of falling?  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[employer], clinical (5-10 min every few days)      Observation/Other Assessments   Focus on Therapeutic Outcomes (FOTO)  NA      Tone   Assessment Location Right Lower Extremity;Left Lower Extremity      ROM / Strength   AROM / PROM / Strength AROM;Strength      AROM   Overall AROM  Within functional limits for tasks performed      Strength   Strength Assessment Site Hip;Knee;Ankle    Right/Left Hip  Right;Left    Right Hip Flexion 5/5    Left Hip Flexion 5/5    Right/Left Knee Right;Left    Right Knee Flexion 5/5    Right Knee Extension 5/5    Left Knee Flexion 5/5    Left Knee Extension 5/5    Right/Left Ankle Right;Left    Right Ankle Dorsiflexion 5/5    Left Ankle Dorsiflexion 5/5      Transfers   Transfers Sit to Stand;Stand to Sit    Sit to Stand 6: Modified independent (Device/Increase time);Without upper extremity assist;From chair/3-in-1    Five time sit to stand comments  9.72    Stand to Sit 6: Modified independent (Device/Increase time);Without upper extremity assist;To chair/3-in-1      Ambulation/Gait   Ambulation/Gait Yes    Ambulation/Gait Assistance 6: Modified independent (Device/Increase time)    Ambulation Distance (Feet) 250 Feet    Assistive device None    Gait Pattern Step-through pattern;Wide base of support    Ambulation Surface Level;Indoor    Gait velocity 10.29 sec =3.19 ft/sec      High Level Balance   High Level Balance Comments SLS:  10 sec each leg.  Standing EO  and EC on solid surface x 30 seconds, then on foam surface x 30 seconds, increased trunk sway on EC on foam, No LOB.      Functional Gait  Assessment   Gait assessed  Yes    Gait Level Surface Walks 20 ft in less than 7 sec but greater than 5.5 sec, uses assistive device, slower speed, mild gait deviations, or deviates 6-10 in outside of the 12 in walkway width.   6.58   Change in Gait Speed Able to smoothly change walking speed without loss of balance or gait deviation. Deviate no more than 6 in outside of the 12 in walkway width.    Gait with Horizontal Head Turns Performs head turns smoothly with no change in gait. Deviates no more than 6 in outside 12 in walkway width    Gait with Vertical Head Turns Performs head turns with no change in gait. Deviates no more than 6 in outside 12 in walkway width.    Gait and Pivot Turn Pivot turns safely within 3 sec and stops quickly with no loss of balance.    Step Over Obstacle Is able to step over one shoe box (4.5 in total height) but must slow down and adjust steps to clear box safely. May require verbal cueing.    Gait with Narrow Base of Support Is able to ambulate for 10 steps heel to toe with no staggering.    Gait with Eyes Closed Walks 20 ft, slow speed, abnormal gait pattern, evidence for imbalance, deviates 10-15 in outside 12 in walkway width. Requires more than 9 sec to ambulate 20 ft.   9 sec slight veering to R   Ambulating Backwards Walks 20 ft, no assistive devices, good speed, no evidence for imbalance, normal gait   15.19   Steps Alternating feet, no rail.    Total Score 25    FGA comment: Scores <22/30 indicate increased fall risk.      RLE Tone   RLE Tone Within Functional Limits      LLE Tone   LLE Tone Within Functional Limits                      Objective measurements completed on examination: See above findings.  PT Education - 12/16/20 1650    Education Details Eval results, PT POC     Person(s) Educated Patient    Methods Explanation    Comprehension Verbalized understanding               PT Long Term Goals - 12/16/20 1657      PT LONG TERM GOAL #1   Title Pt will be independent with HEP for imrpoved balance and gait.  TARGET 01/13/2021    Time 4    Period Weeks    Status New      PT LONG TERM GOAL #2   Title Pt will improve FGA score to at least 28/30 for decreased fall risk.    Baseline 25/30    Time 4    Period Weeks    Status New      PT LONG TERM GOAL #3   Title Pt will verbalize understanding of energy conservation, MS resources.    Time 4    Period Weeks    Status New                  Plan - 12/16/20 1651    Clinical Impression Statement Pt is a 20 year old male with newly diagnosed MS.  He was seen in ED twice in 2 days in early February due to nausea, vomiting, vertigo.  Per Dr. Epimenio Foot neurology note:He has a more aggressive than average presentation. Specifically, he is presenting with a brainstem syndrome and has several other infratentorial and spinal cord lesions.  Pt was hospitalized for several days where he receieved infusions.  Pt reports overall symptoms, including vertigo and balance are better.  He notes occasional difficulty with balance during gait, but no falls.  He presents with slightly decreased balance on complaint surfaces with vision removed and FGA score of 25/30.  He is an active 20 year old, in school and apprenticeship program.  He would benefit from skilled PT to address high level balance and gait to decrease fall risk and improve overall functional independence.    Personal Factors and Comorbidities Comorbidity 2    Comorbidities eczema, HTN    Examination-Activity Limitations Locomotion Level    Examination-Participation Restrictions School;Community Activity    Stability/Clinical Decision Making Stable/Uncomplicated    Clinical Decision Making Low    Rehab Potential Good    PT Frequency 2x / week    PT  Duration 4 weeks   plus eval (may not need full POC)   PT Treatment/Interventions ADLs/Self Care Home Management;Gait training;Functional mobility training;Therapeutic activities;Therapeutic exercise;Balance training;Neuromuscular re-education;Patient/family education    PT Next Visit Plan Initiate HEP for high level balance and dynamic gait and progress each visit as able (pt is only scheduled for 2 weeks initially, and may not need full POC); educate on MS resources and energy conservation    Consulted and Agree with Plan of Care Patient           Patient will benefit from skilled therapeutic intervention in order to improve the following deficits and impairments:  Abnormal gait,Decreased balance  Visit Diagnosis: Other abnormalities of gait and mobility  Unsteadiness on feet     Problem List Patient Active Problem List   Diagnosis Date Noted  . High risk medication use 12/12/2020  . Diplopia 12/12/2020  . Ataxic gait 12/12/2020  . Mild depression (HCC) 12/12/2020  . Multiple sclerosis (HCC) 11/23/2020  . Vertigo 11/23/2020  . Essential hypertension 04/12/2020  . Allergic rhinitis 04/12/2020  . HyperCKemia 01/02/2019  Aphrodite Harpenau W. 12/16/2020, 5:00 PM Gean Maidens., PT  [facility] [address] [place], [place], [address] Phone: [phone]   Fax:  [phone]  Name: Sean Fox. MRN: [id number] Date of Birth: Feb 21, 2001

## 2020-12-19 ENCOUNTER — Telehealth: Payer: Self-pay

## 2020-12-19 NOTE — Telephone Encounter (Signed)
Patient has a positive JCV. Dr. Epimenio Foot recommends patient start Ocrevus.  I called patient to discuss.  No answer, left a voicemail asking patient to call me back.

## 2020-12-19 NOTE — Telephone Encounter (Signed)
JCV ab drawn on 12/12/20 positive, index: 1.65. Gave to MD for review.

## 2020-12-19 NOTE — Telephone Encounter (Signed)
Patient's mother, Crystal, per DPR returned my call.  I advised her that patient's JCV was positive and therefore Dr. Epimenio Foot recommends that he start Ocrevus.  Patient's mother is agreeable to this.  I advised that the order will be sent to our infusion suite and they will start working on insurance approval.  Our infusion suite will call patient to schedule his Ocrevus infusion once this process is complete.  Patient's mother verbalized understanding

## 2020-12-20 ENCOUNTER — Other Ambulatory Visit: Payer: Self-pay

## 2020-12-20 ENCOUNTER — Ambulatory Visit: Payer: BC Managed Care – PPO | Admitting: Physical Therapy

## 2020-12-20 ENCOUNTER — Encounter: Payer: Self-pay | Admitting: Physical Therapy

## 2020-12-20 DIAGNOSIS — R2689 Other abnormalities of gait and mobility: Secondary | ICD-10-CM | POA: Diagnosis not present

## 2020-12-20 DIAGNOSIS — R2681 Unsteadiness on feet: Secondary | ICD-10-CM | POA: Diagnosis not present

## 2020-12-20 NOTE — Patient Instructions (Signed)
Access Code: [email or web address] URL: [email or web address] Date: 12/20/2020 Prepared by: Sallyanne Kuster  Exercises Sit to Stand Without Arm Support - 1 x daily - 5 x weekly - 1 sets - 10 reps Standing March - 1 x daily - 5 x weekly - 1 sets - 3 reps Tandem Walking with Counter Support - 1 x daily - 5 x weekly - 1 sets - 3 reps Heel Walking with Counter Support - 1 x daily - 5 x weekly - 1 sets - 3 reps Toe Walking with Counter Support - 1 x daily - 5 x weekly - 1 sets - 3 reps Standing Near Stance in Corner with Eyes Closed - 1 x daily - 5 x weekly - 1 sets - 3 reps - 30 hold Standing Balance in Corner with Eyes Closed - 1 x daily - 5 x weekly - 1 sets - 10 reps Wide Tandem Stance on Foam Pad with Eyes Closed - 1 x daily - 5 x weekly - 1 sets - 3 reps - 30 hold

## 2020-12-20 NOTE — Telephone Encounter (Signed)
Ocrevus start form faxed to [place].  Received a receipt of confirmation.  Order, start form, demographics, insurance, imaging,office notes, and labs given to Infusion Suite to start process.

## 2020-12-21 NOTE — Therapy (Signed)
[facility] [address] [place], [place], [address] Phone: [phone]   Fax:  [phone]  Physical Therapy Treatment  Patient Details  Name: Sean Fox. MRN: [id number] Date of Birth: 09/19/2001 Referring Provider (PT): Despina Arias, MD   Encounter Date: 12/20/2020   PT End of Session - 12/20/20 0936    Visit Number 2    Number of Visits 9    Authorization Type BCBS    PT Start Time 0933    PT Stop Time 1012    PT Time Calculation (min) 39 min    Equipment Utilized During Treatment Gait belt    Activity Tolerance Patient tolerated treatment well    Behavior During Therapy WFL for tasks assessed/performed           Past Medical History:  Diagnosis Date  . Eczema   . Hypertension     Past Surgical History:  Procedure Laterality Date  . NO PAST SURGERIES      There were no vitals filed for this visit.   Subjective Assessment - 12/20/20 0936    Subjective No new complaints. No falls or pain to report.    Patient Stated Goals Pt's goal for therapy:  unsure-don't know about physical therapy and what that will do for me.  Would be nice to get back into workout routine    Currently in Pain? No/denies                  [facility] Adult PT Treatment/Exercise - 12/20/20 0939      Transfers   Transfers Sit to Stand;Stand to Sit    Sit to Stand 6: Modified independent (Device/Increase time);Without upper extremity assist;From chair/3-in-1    Stand to Sit 6: Modified independent (Device/Increase time);Without upper extremity assist;To chair/3-in-1      Ambulation/Gait   Ambulation/Gait Yes    Ambulation/Gait Assistance 6: Modified independent (Device/Increase time)    Ambulation Distance (Feet) --   around gym with session   Assistive device None    Gait Pattern Step-through pattern;Wide base of support    Ambulation Surface Level;Indoor      Exercises   Exercises Other Exercises    Other Exercises   issued ex's for strengthening/balance. refer to [employer] program for full details.          Issued the following to HEP today. Min guard assist for safety with balance ex's. No issues noted or reported with performance in session.    Access Code: [email or web address] URL: [email or web address] Date: 12/20/2020 Prepared by: Sallyanne Kuster  Exercises Sit to Stand Without Arm Support - 1 x daily - 5 x weekly - 1 sets - 10 reps Standing March - 1 x daily - 5 x weekly - 1 sets - 3 reps Tandem Walking with Counter Support - 1 x daily - 5 x weekly - 1 sets - 3 reps Heel Walking with Counter Support - 1 x daily - 5 x weekly - 1 sets - 3 reps Toe Walking with Counter Support - 1 x daily - 5 x weekly - 1 sets - 3 reps Standing Near Stance in Corner with Eyes Closed - 1 x daily - 5 x weekly - 1 sets - 3 reps - 30 hold Standing Balance in Corner with Eyes Closed - 1 x daily - 5 x weekly - 1 sets - 10 reps Wide Tandem Stance on Foam Pad with Eyes Closed - 1 x daily - 5 x weekly - 1 sets -  3 reps - 30 hold        PT Education - 12/20/20 1700    Education Details HEP for strengthening and balance    Person(s) Educated Patient    Methods Explanation;Demonstration;Verbal cues;Handout    Comprehension Verbalized understanding;Returned demonstration;Verbal cues required;Need further instruction               PT Long Term Goals - 12/16/20 1657      PT LONG TERM GOAL #1   Title Pt will be independent with HEP for imrpoved balance and gait.  TARGET 01/13/2021    Time 4    Period Weeks    Status New      PT LONG TERM GOAL #2   Title Pt will improve FGA score to at least 28/30 for decreased fall risk.    Baseline 25/30    Time 4    Period Weeks    Status New      PT LONG TERM GOAL #3   Title Pt will verbalize understanding of energy conservation, MS resources.    Time 4    Period Weeks    Status New                 Plan - 12/20/20 4098    Clinical Impression Statement  Today's skilled session focused on establishment of an HEP to address strengthening and balance. No issues noted or reported. The pt is progressing toward goals and should benefit from continued PT to progress toward unmet goals.    Personal Factors and Comorbidities Comorbidity 2    Comorbidities eczema, HTN    Examination-Activity Limitations Locomotion Level    Examination-Participation Restrictions School;Community Activity    Stability/Clinical Decision Making Stable/Uncomplicated    Rehab Potential Good    PT Frequency 2x / week    PT Duration 4 weeks   plus eval (may not need full POC)   PT Treatment/Interventions ADLs/Self Care Home Management;Gait training;Functional mobility training;Therapeutic activities;Therapeutic exercise;Balance training;Neuromuscular re-education;Patient/family education    PT Next Visit Plan continue to work on high level balance and dynamic gait and progress HEP each visit as able (pt is only scheduled for 2 weeks initially, and may not need full POC); educate on MS resources and energy conservation    PT Home Exercise Plan Access Code: [email or web address]    Consulted and Agree with Plan of Care Patient           Patient will benefit from skilled therapeutic intervention in order to improve the following deficits and impairments:  Abnormal gait,Decreased balance  Visit Diagnosis: Other abnormalities of gait and mobility  Unsteadiness on feet     Problem List Patient Active Problem List   Diagnosis Date Noted  . High risk medication use 12/12/2020  . Diplopia 12/12/2020  . Ataxic gait 12/12/2020  . Mild depression (HCC) 12/12/2020  . Multiple sclerosis (HCC) 11/23/2020  . Vertigo 11/23/2020  . Essential hypertension 04/12/2020  . Allergic rhinitis 04/12/2020  . HyperCKemia 01/02/2019    Sallyanne Kuster, PTA, [facility] [address], [address] [place], [place] [address] [phone] 12/21/20, 1:42 PM   Name: Talton Delpriore. MRN:  [id number] Date of Birth: 15-Feb-2001

## 2020-12-22 ENCOUNTER — Other Ambulatory Visit: Payer: Self-pay

## 2020-12-22 ENCOUNTER — Encounter: Payer: Self-pay | Admitting: Physical Therapy

## 2020-12-22 ENCOUNTER — Ambulatory Visit: Payer: BC Managed Care – PPO | Admitting: Physical Therapy

## 2020-12-22 DIAGNOSIS — R2689 Other abnormalities of gait and mobility: Secondary | ICD-10-CM

## 2020-12-22 DIAGNOSIS — R2681 Unsteadiness on feet: Secondary | ICD-10-CM | POA: Diagnosis not present

## 2020-12-23 NOTE — Therapy (Signed)
[facility] [address] [place], [place], [address] Phone: [phone]   Fax:  [phone]  Physical Therapy Treatment  Patient Details  Name: Sean Fox. MRN: [id number] Date of Birth: 03/11/2001 Referring Provider (PT): Despina Arias, MD   Encounter Date: 12/22/2020   PT End of Session - 12/22/20 1020    Visit Number 3    Number of Visits 9    Authorization Type BCBS    PT Start Time 1018    PT Stop Time 1057    PT Time Calculation (min) 39 min    Equipment Utilized During Treatment Gait belt    Activity Tolerance Patient tolerated treatment well    Behavior During Therapy WFL for tasks assessed/performed           Past Medical History:  Diagnosis Date  . Eczema   . Hypertension     Past Surgical History:  Procedure Laterality Date  . NO PAST SURGERIES      There were no vitals filed for this visit.   Subjective Assessment - 12/22/20 1019    Subjective No new complaints. No falls or pain to report. Blood work showed he was JCV positive so will be starting the Ocevis treatments once approved by insurance. [facility] infusion team to call him, has not heard from them as of yet.    Patient Stated Goals Pt's goal for therapy:  unsure-don't know about physical therapy and what that will do for me.  Would be nice to get back into workout routine    Currently in Pain? No/denies                  [facility] Adult PT Treatment/Exercise - 12/22/20 1021      Transfers   Transfers Sit to Stand;Stand to Sit    Sit to Stand 6: Modified independent (Device/Increase time);Without upper extremity assist;From chair/3-in-1    Stand to Sit 6: Modified independent (Device/Increase time);Without upper extremity assist;To chair/3-in-1      Ambulation/Gait   Ambulation/Gait Yes    Ambulation/Gait Assistance 6: Modified independent (Device/Increase time)    Assistive device None    Gait Pattern Step-through pattern;Wide  base of support    Ambulation Surface Level;Indoor      Self-Care   Self-Care Other Self-Care Comments    Other Self-Care Comments  Provided pt with information on MS society, handout talking about MS management/fatigue and how to manage the fatigue and a list of MS support groups. Reviewed the packet with patient and addressed any questions he had about the information in session today.      Neuro Re-ed    Neuro Re-ed Details  for balance/muscle re-ed/coordination: on balance board with no UE support performed both ways on balance board- rocking the board with EO, progressing to EC with emphasis on tall posture. Then holding the board steady for EC 30 sec's x 3 reps, progressing to EC head movements left<>right, up<>down ~10 reps each, min guard assist for safety.  Then on balance board in ant/posterior direction with 2 tall cones in front: alternating fwd, lateral, double fwd, double lateral foot taps for ~10 reps each with cues on posture and weight shifting, min guard assist.               Balance Exercises - 12/22/20 1052      Balance Exercises: Standing   Sit to Stand Standard surface;Without upper extremity support;Foam/compliant surface;Limitations    Sit to Stand Limitations seated at edge  of mat with arms across chest: sit<>stands x 10 reps with emphasis on tall posture and slow, controlled descent back to mat. min guard assist for safety.                  PT Long Term Goals - 12/16/20 1657      PT LONG TERM GOAL #1   Title Pt will be independent with HEP for imrpoved balance and gait.  TARGET 01/13/2021    Time 4    Period Weeks    Status New      PT LONG TERM GOAL #2   Title Pt will improve FGA score to at least 28/30 for decreased fall risk.    Baseline 25/30    Time 4    Period Weeks    Status New      PT LONG TERM GOAL #3   Title Pt will verbalize understanding of energy conservation, MS resources.    Time 4    Period Weeks    Status New                  Plan - 12/22/20 1020    Clinical Impression Statement Today's skilled session focused on providing MS education and balance training with no issues noted or reported in session. The pt is progressing toward goals very well and after discussion will plan to wrap up PT next week. PT in agreement with this plan.    Personal Factors and Comorbidities Comorbidity 2    Comorbidities eczema, HTN    Examination-Activity Limitations Locomotion Level    Examination-Participation Restrictions School;Community Activity    Stability/Clinical Decision Making Stable/Uncomplicated    Rehab Potential Good    PT Frequency 2x / week    PT Duration 4 weeks   plus eval (may not need full POC)   PT Treatment/Interventions ADLs/Self Care Home Management;Gait training;Functional mobility training;Therapeutic activities;Therapeutic exercise;Balance training;Neuromuscular re-education;Patient/family education    PT Next Visit Plan begin to check goals/finalize HEP with plan to wrap up PT over next 1-2 visits    PT Home Exercise Plan Access Code: [email or web address]    Consulted and Agree with Plan of Care Patient           Patient will benefit from skilled therapeutic intervention in order to improve the following deficits and impairments:  Abnormal gait,Decreased balance  Visit Diagnosis: Other abnormalities of gait and mobility  Unsteadiness on feet     Problem List Patient Active Problem List   Diagnosis Date Noted  . High risk medication use 12/12/2020  . Diplopia 12/12/2020  . Ataxic gait 12/12/2020  . Mild depression (HCC) 12/12/2020  . Multiple sclerosis (HCC) 11/23/2020  . Vertigo 11/23/2020  . Essential hypertension 04/12/2020  . Allergic rhinitis 04/12/2020  . HyperCKemia 01/02/2019    Sallyanne Kuster, PTA, [facility] [address], [address] [place], [place] [address] [phone] 12/23/20, 11:09 AM   Name: Sean Fox. MRN: [id number] Date of Birth:  12-May-2001

## 2020-12-27 ENCOUNTER — Other Ambulatory Visit: Payer: Self-pay

## 2020-12-27 ENCOUNTER — Ambulatory Visit: Payer: BC Managed Care – PPO | Admitting: Physical Therapy

## 2020-12-27 ENCOUNTER — Encounter: Payer: Self-pay | Admitting: Physical Therapy

## 2020-12-27 DIAGNOSIS — R2681 Unsteadiness on feet: Secondary | ICD-10-CM

## 2020-12-27 DIAGNOSIS — R2689 Other abnormalities of gait and mobility: Secondary | ICD-10-CM

## 2020-12-27 NOTE — Therapy (Signed)
[facility] [address] [place], [place], [address] Phone: [phone]   Fax:  [phone]  Physical Therapy Treatment  Patient Details  Name: Sean Fox. MRN: [id number] Date of Birth: 02/23/2001 Referring Provider (PT): Arlice Colt, MD   Encounter Date: 12/27/2020   PT End of Session - 12/27/20 0851    Visit Number 4    Number of Visits 9    Authorization Type BCBS    PT Start Time 0845    PT Stop Time 0900   discharge visit, not all time was needed   PT Time Calculation (min) 15 min    Equipment Utilized During Treatment Gait belt    Activity Tolerance Patient tolerated treatment well    Behavior During Therapy WFL for tasks assessed/performed           Past Medical History:  Diagnosis Date  . Eczema   . Hypertension     Past Surgical History:  Procedure Laterality Date  . NO PAST SURGERIES      There were no vitals filed for this visit.   Subjective Assessment - 12/27/20 0850    Subjective No new complatins. No falls or pain to report. Has not heard anything new from infusion center. HEP is going well, has been alternating the ex's so not to over fatigue himself.    Patient Stated Goals Pt's goal for therapy:  unsure-don't know about physical therapy and what that will do for me.  Would be nice to get back into workout routine    Currently in Pain? No/denies              [facility] PT Assessment - 12/27/20 0852      Functional Gait  Assessment   Gait assessed  Yes    Gait Level Surface Walks 20 ft in less than 5.5 sec, no assistive devices, good speed, no evidence for imbalance, normal gait pattern, deviates no more than 6 in outside of the 12 in walkway width.   5.41 sesc's   Change in Gait Speed Able to smoothly change walking speed without loss of balance or gait deviation. Deviate no more than 6 in outside of the 12 in walkway width.    Gait with Horizontal Head Turns Performs head turns  smoothly with no change in gait. Deviates no more than 6 in outside 12 in walkway width    Gait with Vertical Head Turns Performs head turns with no change in gait. Deviates no more than 6 in outside 12 in walkway width.    Gait and Pivot Turn Pivot turns safely within 3 sec and stops quickly with no loss of balance.    Step Over Obstacle Is able to step over 2 stacked shoe boxes taped together (9 in total height) without changing gait speed. No evidence of imbalance.    Gait with Narrow Base of Support Is able to ambulate for 10 steps heel to toe with no staggering.    Gait with Eyes Closed Walks 20 ft, uses assistive device, slower speed, mild gait deviations, deviates 6-10 in outside 12 in walkway width. Ambulates 20 ft in less than 9 sec but greater than 7 sec.   9.15 sec's   Ambulating Backwards Walks 20 ft, no assistive devices, good speed, no evidence for imbalance, normal gait    Steps Alternating feet, no rail.    Total Score 29    FGA comment: 29/30 scored today  [facility] PT Treatment/Exercise - 12/27/20 0858      Transfers   Transfers Sit to Stand;Stand to Sit    Sit to Stand 6: Modified independent (Device/Increase time);Without upper extremity assist;From chair/3-in-1    Stand to Sit 6: Modified independent (Device/Increase time);Without upper extremity assist;To chair/3-in-1      Ambulation/Gait   Ambulation/Gait Yes    Ambulation/Gait Assistance 6: Modified independent (Device/Increase time)    Assistive device None    Gait Pattern Step-through pattern;Wide base of support    Ambulation Surface Level;Indoor      Self-Care   Self-Care Other Self-Care Comments    Other Self-Care Comments  reviewed MS information given last session. Pt without any questions. Also revviewed current HEP. Remains appropriate with pt to continue to work on it. Also discussed pt joining a gym. He is going to look into this.                PT Long Term Goals -  12/27/20 0851      PT LONG TERM GOAL #1   Title Pt will be independent with HEP for imrpoved balance and gait.  TARGET 01/13/2021    Baseline 12/27/20: met wtth current HEP    Time --    Period --    Status Achieved      PT LONG TERM GOAL #2   Title Pt will improve FGA score to at least 28/30 for decreased fall risk.    Baseline 12/27/20: 29/30 scored today    Time --    Period --    Status Achieved      PT LONG TERM GOAL #3   Title Pt will verbalize understanding of energy conservation, MS resources.    Baseline 12/27/20: met in session today    Status Achieved                 Plan - 12/27/20 0851    Clinical Impression Statement Today's skilled session focused on pt's LTGs with all goals met. Pt scored 29/30 on Functional Gait Index. Pt without any questions on HEP, MS resources provided at last session or energy conservation information provided along with MS information provided last session. Pt is agreeable to discharge today.    Personal Factors and Comorbidities Comorbidity 2    Comorbidities eczema, HTN    Examination-Activity Limitations Locomotion Level    Examination-Participation Restrictions School;Community Activity    Stability/Clinical Decision Making Stable/Uncomplicated    Rehab Potential Good    PT Frequency 2x / week    PT Duration 4 weeks   plus eval (may not need full POC)   PT Treatment/Interventions ADLs/Self Care Home Management;Gait training;Functional mobility training;Therapeutic activities;Therapeutic exercise;Balance training;Neuromuscular re-education;Patient/family education    PT Next Visit Plan discharge today    PT Home Exercise Plan Access Code: [email or web address]    Consulted and Agree with Plan of Care Patient           Patient will benefit from skilled therapeutic intervention in order to improve the following deficits and impairments:  Abnormal gait,Decreased balance  Visit Diagnosis: Other abnormalities of gait and mobility  Unsteadiness  on feet     Problem List Patient Active Problem List   Diagnosis Date Noted  . High risk medication use 12/12/2020  . Diplopia 12/12/2020  . Ataxic gait 12/12/2020  . Mild depression ([place]) 12/12/2020  . Multiple sclerosis ([place]) 11/23/2020  . Vertigo 11/23/2020  . Essential hypertension 04/12/2020  . Allergic rhinitis 04/12/2020  . HyperCKemia 01/02/2019  Willow Ora, PTA, [facility] [address], [place], [place] [address] [phone] 12/27/20, 9:05 AM   Name: Sean Fox. MRN: [id number] Date of Birth: [birth year]

## 2020-12-28 ENCOUNTER — Encounter: Payer: Self-pay | Admitting: Physical Therapy

## 2020-12-28 NOTE — Therapy (Signed)
[facility] [address] [place], [place], [address] Phone: [phone]   Fax:  [phone]  Patient Details  Name: Sean Fox. MRN: [id number] Date of Birth: 09/12/2001 Referring Provider:  No ref. provider found  Encounter Date: 12/28/2020   PHYSICAL THERAPY DISCHARGE SUMMARY  Visits from Start of Care: 4  Current functional level related to goals / functional outcomes:  PT Long Term Goals - 12/27/20 0851      PT LONG TERM GOAL #1   Title Pt will be independent with HEP for imrpoved balance and gait.  TARGET 01/13/2021    Baseline 12/27/20: met wtth current HEP    Time --    Period --    Status Achieved      PT LONG TERM GOAL #2   Title Pt will improve FGA score to at least 28/30 for decreased fall risk.    Baseline 12/27/20: 29/30 scored today    Time --    Period --    Status Achieved      PT LONG TERM GOAL #3   Title Pt will verbalize understanding of energy conservation, MS resources.    Baseline 12/27/20: met in session today    Status Achieved          Pt has met 3 of 3 LTGs.   Remaining deficits: High level balance (has improved)   Education / Equipment: Educated in [employer] and MS resources, energy conservation.  Plan: Patient agrees to discharge.  Patient goals were met. Patient is being discharged due to meeting the stated rehab goals.  ?????      Abraham Entwistle W. 12/28/2020, 5:57 PM Mady Haagensen, PT 12/28/20 5:58 PM Phone: [phone] Fax: [facility] [address] [place], [place], [address] Phone: [phone]   Fax:  [phone]

## 2020-12-29 ENCOUNTER — Ambulatory Visit: Payer: BC Managed Care – PPO | Admitting: Physical Therapy

## 2021-01-18 DIAGNOSIS — G35 Multiple sclerosis: Secondary | ICD-10-CM | POA: Diagnosis not present

## 2021-01-23 NOTE — Telephone Encounter (Signed)
Patient's first dose of Ocrevus 300 mg was on January 18, 2021.  His next 300 mg dose of Ocrevus will be on February 02, 2021.

## 2021-02-01 DIAGNOSIS — G35 Multiple sclerosis: Secondary | ICD-10-CM | POA: Diagnosis not present

## 2021-03-14 ENCOUNTER — Ambulatory Visit (INDEPENDENT_AMBULATORY_CARE_PROVIDER_SITE_OTHER): Payer: BC Managed Care – PPO | Admitting: Neurology

## 2021-03-14 ENCOUNTER — Encounter: Payer: Self-pay | Admitting: Neurology

## 2021-03-14 VITALS — BP 136/74 | HR 54 | Ht 66.0 in | Wt 190.5 lb

## 2021-03-14 DIAGNOSIS — Z79899 Other long term (current) drug therapy: Secondary | ICD-10-CM

## 2021-03-14 DIAGNOSIS — F32A Depression, unspecified: Secondary | ICD-10-CM

## 2021-03-14 DIAGNOSIS — G35 Multiple sclerosis: Secondary | ICD-10-CM | POA: Diagnosis not present

## 2021-03-14 DIAGNOSIS — F320 Major depressive disorder, single episode, mild: Secondary | ICD-10-CM

## 2021-03-14 DIAGNOSIS — R260 Ataxic gait: Secondary | ICD-10-CM

## 2021-03-14 MED ORDER — VITAMIN D (ERGOCALCIFEROL) 1.25 MG (50000 UNIT) PO CAPS: 50000.0000 [IU] | ORAL_CAPSULE | ORAL | 1 refills | Status: DC

## 2021-03-14 NOTE — Progress Notes (Signed)
[facility]  PATIENT: Sean Fox. DOB: [birth year]  REFERRING DOCTOR OR PCP:  Jacquiline Doe, MD / Lanae Boast, NP SOURCE: Patient, notes from recent hospital admission, imaging and lab results, MRI images personally reviewed.  _________________________________   HISTORICAL  CHIEF COMPLAINT:  Chief Complaint  Patient presents with  . Multiple Sclerosis    Room 12, mom Crystal in room    HISTORY OF PRESENT ILLNESS:  Sean Fox is a 20 y.o. man with relapsing remitting multiple sclerosis.  UPDATE 03/14/2021: Since the last visit, he has started Ocrevus (infusions 01/18/2021 and 02/02/2021). He denies any exacerbations or new symptoms.      He denies any difficulty with his gait, strength or sensation.    He denies diplopia or other visual issue.  No nausea.    No difficulty with his bladder.  He still notes some fatigue.   He sleeps well most nights.    He notes mild cognitive issues with decreased focus/attention and word finding issues     He notes mild depression/anxiety since his diagnosis.   MS HISTORY: He presented to [facility] 11/23/2020 with double vision, nystagmus, dizziness, altered taste and left facial paresthesias.  Symptoms started with a headache 11/19/20 and he woke up with dizziness the next day 11/20/2020.  The next day he had diplopia and nystagmus and saw urgent care.   The next day 11/22/2020 he also had nausea, vomiting.   His PCP ordered an MRI that was performed 11/23/2020.  It showed multiple lesions c/w MS in supratentoiral and infratentorial white matter. and he was sent to the ED.   Further MRI showed an enhancing lesion in the left pons/middle cerebellar peduncle.   MRI cervical spine showed 2 more lesions.  He was admitted and received 5 days of IV Slu-medrol l with improvement of the symptoms      He started [facility] April 2022  In retrospect, 3 years ago, he had numbness in the left leg with some ataxia lasting 2-3 weeks.   He notes  mild visual blurring at times and was told he has mild astigmatism.      FH:   Multiple sclerosis in a maternal uncle (had severe neurologic issues starting at age 57's but not seen; became bed-bound and died), cousin (diagnosed around age 68 and doing well) and probably grandfather (progressive neurologic issues affecting gait).     Imaging studies MRI of the brain 11/23/2020 showed multiple T2/FLAIR hyperintense foci in the hemispheres predominantly in the periventricular white matter but also in the juxtacortical and deep white matter.  Foci were also present in the left middle cerebral peduncle, right middle cerebellar peduncle, left middle cerebellar peduncle and right cerebellar hemisphere..   The large focus in the left middle cerebellar peduncle enhanced after contrast.  None of the other foci enhanced.  MRI of the cervical spine 11/24/2020 showed posterior lesions at C2-C3 and C3-C4 consistent with demyelination.  They did not enhance.  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  There are mild degenerative changes at C3-C4, C5-C6 and C7-T1.  Laboratory tests: NMO Ab, HIV, B12, anti-Jo1, ENA, ACE, CBC/diff and CMP were negative or unremarkable. Vitamin D was reduced at 22.49  REVIEW OF SYSTEMS: Constitutional: No fevers, chills, sweats, or change in appetite Eyes: No visual changes, double vision, eye pain Ear, nose and throat: No hearing loss, ear pain, nasal congestion, sore throat Cardiovascular: No chest pain, palpitations Respiratory: No shortness of breath at rest or with exertion.  No wheezes GastrointestinaI: No nausea, vomiting, diarrhea, abdominal pain, fecal incontinence Genitourinary: No dysuria, urinary retention or frequency.  No nocturia. Musculoskeletal: No neck pain, back pain Integumentary: No rash, pruritus, skin lesions Neurological: as above Psychiatric: No depression at this time.  No anxiety Endocrine: No palpitations, diaphoresis, change in appetite, change in weigh  or increased thirst Hematologic/Lymphatic: No anemia, purpura, petechiae. Allergic/Immunologic: No itchy/runny eyes, nasal congestion, recent allergic reactions, rashes  ALLERGIES: Allergies  Allergen Reactions  . Gramineae Pollens   . Mold Extract [Trichophyton]     HOME MEDICATIONS:  Current Outpatient Medications:  .  acetaminophen (TYLENOL) 325 MG tablet, Take 650 mg by mouth every 6 (six) hours as needed for mild pain, fever or headache., Disp: , Rfl:  .  cetirizine (ZYRTEC) 10 MG tablet, Take 10 mg by mouth daily., Disp: , Rfl: 6 .  Multiple Vitamin (MULTIVITAMIN WITH MINERALS) TABS tablet, Take 1 tablet by mouth daily., Disp: , Rfl:  .  ondansetron (ZOFRAN ODT) 8 MG disintegrating tablet, Take 1 tablet (8 mg total) by mouth every 8 (eight) hours as needed for nausea or vomiting., Disp: 20 tablet, Rfl: 0 .  SUPER B COMPLEX/C PO, Take 1 tablet by mouth daily., Disp: , Rfl:  .  meclizine (ANTIVERT) 25 MG tablet, Take 1 tablet (25 mg total) by mouth 3 (three) times daily as needed for dizziness., Disp: 15 tablet, Rfl: 0 .  Vitamin D, Ergocalciferol, (DRISDOL) 1.25 MG (50000 UNIT) CAPS capsule, Take 1 capsule (50,000 Units total) by mouth every 7 (seven) days., Disp: 13 capsule, Rfl: 1  PAST MEDICAL HISTORY: Past Medical History:  Diagnosis Date  . Eczema   . Hypertension   . Multiple sclerosis (HCC)     PAST SURGICAL HISTORY: Past Surgical History:  Procedure Laterality Date  . NO PAST SURGERIES      FAMILY HISTORY: Family History  Problem Relation Age of Onset  . Hypertension Mother   . Hypertension Father   . Lupus Sister   . Multiple sclerosis Maternal Uncle   . Migraines Neg Hx   . Seizures Neg Hx   . Autism Neg Hx   . ADD / ADHD Neg Hx   . Anxiety disorder Neg Hx   . Depression Neg Hx   . Bipolar disorder Neg Hx   . Schizophrenia Neg Hx     SOCIAL HISTORY:  Social History   Socioeconomic History  . Marital status: Single    Spouse name: Not on file   . Number of children: 0  . Years of education: 12+  . Highest education level: Not on file  Occupational History  . Occupation: [employer], attending college  Tobacco Use  . Smoking status: Never Smoker  . Smokeless tobacco: Never Used  Vaping Use  . Vaping Use: Never used  Substance and Sexual Activity  . Alcohol use: Never  . Drug use: Never  . Sexual activity: Not on file  Other Topics Concern  . Not on file  Social History Narrative   Left handed   Caffeine use: 3-4 cups coffee per week   Lives with mom dad and sister visits she is in college. He takes online college courses   Social Determinants of Corporate investment banker Strain: Not on file  Food Insecurity: Not on file  Transportation Needs: Not on file  Physical Activity: Not on file  Stress: Not on file  Social Connections: Not on file  Intimate Partner Violence: Not on file  PHYSICAL EXAM  Vitals:   03/14/21 1529  BP: 136/74  Pulse: (!) 54  Weight: 190 lb 8 oz (86.4 kg)  Height: 5\' 6"  (1.676 m)    Body mass index is 30.75 kg/m.   No exam data present    General: The patient is well-developed and well-nourished and in no acute distress  HEENT:  Head is [place]/AT.  Sclera are anicteric.   Skin: Extremities are without rash or  edema.  Mental status: The patient is alert and oriented x 3 at the time of the examination. The patient has apparent normal recent and remote memory, with an apparently normal attention span and concentration ability.   Speech is normal.  Cranial nerves: Extraocular movements are full. Colors are symmetric.    Facial symmetry is present. There is good facial sensation to soft touch bilaterally.Facial strength is normal.  Trapezius and sternocleidomastoid strength is normal. No dysarthria is noted.    No obvious hearing deficits are noted.  Motor:  Muscle bulk is normal.   Tone is normal. Strength is  5 / 5 in all 4 extremities.   Sensory: Sensory testing is intact to  pinprick, soft touch and vibration sensation in all 4 extremities.  Coordination: Cerebellar testing reveals good finger-nose-finger and heel-to-shin bilaterally.  Gait and station: Station is normal.   Gait is normal. Tandem gait is minimally wide. Romberg is negative.   Reflexes: Deep tendon reflexes are symmetric and normal bilaterally.        DIAGNOSTIC DATA (LABS, IMAGING, TESTING) - I reviewed patient records, labs, notes, testing and imaging myself where available.  Lab Results  Component Value Date   WBC 20.2 (H) 11/26/2020   HGB 14.9 11/26/2020   HCT 44.0 11/26/2020   MCV 92.6 11/26/2020   PLT 266 11/26/2020      Component Value Date/Time   NA 140 11/26/2020 0541   K 4.2 11/26/2020 0541   CL 105 11/26/2020 0541   CO2 25 11/26/2020 0541   GLUCOSE 109 (H) 11/26/2020 0541   BUN 17 11/26/2020 0541   CREATININE 0.89 11/26/2020 0541   CREATININE 0.99 11/21/2018 1406   CALCIUM 9.3 11/26/2020 0541   PROT 8.1 11/22/2020 1101   ALBUMIN 4.8 11/22/2020 1101   AST 17 11/22/2020 1101   ALT 19 11/22/2020 1101   ALKPHOS 60 11/22/2020 1101   BILITOT 0.6 11/22/2020 1101   GFRNONAA >60 11/26/2020 0541   No results found for: CHOL, HDL, LDLCALC, LDLDIRECT, TRIG, CHOLHDL Lab Results  Component Value Date   HGBA1C 5.5 11/21/2018   Lab Results  Component Value Date   VITAMINB12 747 11/23/2020   Lab Results  Component Value Date   TSH 0.97 11/21/2018       ASSESSMENT AND PLAN  Multiple sclerosis (HCC)  High risk medication use  Ataxic gait  Mild depression (HCC)  1.   Continue Ocrevus 2.   High dose Vit D supplements x 6 months  3.   Stay active and exercise as tolerated.  Advised to call 01/20/2019 if depression worsens. 4.   RTC 6 months or sooner if new or worsening symptoms.    We will check an MRI around that time to determine if subclinical progression and consider a different DMT if occurring.  Staceyann Knouff A. [place], MD, [facility] 03/14/2021, 4:32 PM Certified in  Neurology, Clinical Neurophysiology, Sleep Medicine and Neuroimaging  [facility] [address], [address] [place], [place] [phone]

## 2021-04-04 DIAGNOSIS — H5201 Hypermetropia, right eye: Secondary | ICD-10-CM | POA: Diagnosis not present

## 2021-04-14 ENCOUNTER — Encounter: Payer: BC Managed Care – PPO | Admitting: Family Medicine

## 2021-04-21 ENCOUNTER — Encounter: Payer: Self-pay | Admitting: Family Medicine

## 2021-04-21 ENCOUNTER — Ambulatory Visit (INDEPENDENT_AMBULATORY_CARE_PROVIDER_SITE_OTHER): Payer: BC Managed Care – PPO | Admitting: Family Medicine

## 2021-04-21 ENCOUNTER — Other Ambulatory Visit: Payer: Self-pay

## 2021-04-21 VITALS — BP 137/75 | HR 61 | Temp 97.8°F | Ht 66.0 in | Wt 189.0 lb

## 2021-04-21 DIAGNOSIS — G35 Multiple sclerosis: Secondary | ICD-10-CM | POA: Diagnosis not present

## 2021-04-21 DIAGNOSIS — F320 Major depressive disorder, single episode, mild: Secondary | ICD-10-CM

## 2021-04-21 DIAGNOSIS — Z13220 Encounter for screening for lipoid disorders: Secondary | ICD-10-CM | POA: Diagnosis not present

## 2021-04-21 DIAGNOSIS — Z6830 Body mass index (BMI) 30.0-30.9, adult: Secondary | ICD-10-CM

## 2021-04-21 DIAGNOSIS — Z131 Encounter for screening for diabetes mellitus: Secondary | ICD-10-CM

## 2021-04-21 DIAGNOSIS — F32A Depression, unspecified: Secondary | ICD-10-CM

## 2021-04-21 DIAGNOSIS — Z0001 Encounter for general adult medical examination with abnormal findings: Secondary | ICD-10-CM | POA: Diagnosis not present

## 2021-04-21 DIAGNOSIS — E559 Vitamin D deficiency, unspecified: Secondary | ICD-10-CM | POA: Diagnosis not present

## 2021-04-21 DIAGNOSIS — E669 Obesity, unspecified: Secondary | ICD-10-CM

## 2021-04-21 LAB — CBC
HCT: 43.5 % (ref 39.0–52.0)
Hemoglobin: 15 g/dL (ref 13.0–17.0)
MCHC: 34.4 g/dL (ref 30.0–36.0)
MCV: 91.2 fl (ref 78.0–100.0)
Platelets: 274 10*3/uL (ref 150.0–400.0)
RBC: 4.77 Mil/uL (ref 4.22–5.81)
RDW: 12.8 % (ref 11.5–14.6)
WBC: 3.5 10*3/uL — ABNORMAL LOW (ref 4.5–10.5)

## 2021-04-21 LAB — COMPREHENSIVE METABOLIC PANEL
ALT: 18 U/L (ref 0–53)
AST: 18 U/L (ref 0–37)
Albumin: 4.6 g/dL (ref 3.5–5.2)
Alkaline Phosphatase: 56 U/L (ref 39–117)
BUN: 11 mg/dL (ref 6–23)
CO2: 26 mEq/L (ref 19–32)
Calcium: 9.7 mg/dL (ref 8.4–10.5)
Chloride: 106 mEq/L (ref 96–112)
Creatinine, Ser: 1.04 mg/dL (ref 0.40–1.50)
GFR: 103.69 mL/min (ref >60.00)
Glucose, Bld: 83 mg/dL (ref 70–99)
Potassium: 4.3 mEq/L (ref 3.5–5.1)
Sodium: 140 mEq/L (ref 135–145)
Total Bilirubin: 0.5 mg/dL (ref 0.2–1.2)
Total Protein: 7.2 g/dL (ref 6.0–8.3)

## 2021-04-21 LAB — LIPID PANEL
Cholesterol: 184 mg/dL (ref 0–200)
HDL: 47.1 mg/dL (ref >39.00)
LDL Cholesterol: 125 mg/dL — ABNORMAL HIGH (ref 0–99)
NonHDL: 136.5
Total CHOL/HDL Ratio: 4
Triglycerides: 57 mg/dL (ref 0.0–149.0)
VLDL: 11.4 mg/dL (ref 0.0–40.0)

## 2021-04-21 LAB — TSH: TSH: 1.04 u[IU]/mL (ref 0.35–5.50)

## 2021-04-21 LAB — VITAMIN D 25 HYDROXY (VIT D DEFICIENCY, FRACTURES): VITD: 27.24 ng/mL — ABNORMAL LOW (ref 30.00–100.00)

## 2021-04-21 NOTE — Patient Instructions (Signed)
It was very nice to see you today!  We are glad that you are doing well.  We will check blood work today.  Please continue work on diet and exercise.  I will see back in year for your next physical.  Please come back to see me sooner if needed.  Take care, Dr Jimmey Ralph  PLEASE NOTE:  If you had any lab tests please let us know if you have not heard back within a few days. You may see your results on mychart before we have a chance to review them but we will give you a call once they are reviewed by [place]. If we ordered any referrals today, please let us know if you have not heard from their office within the next week.   Please try these tips to maintain a healthy lifestyle:  Eat at least 3 REAL meals and 1-2 snacks per day.  Aim for no more than 5 hours between eating.  If you eat breakfast, please do so within one hour of getting up.   Each meal should contain half fruits/vegetables, one quarter protein, and one quarter carbs (no bigger than a computer mouse)  Cut down on sweet beverages. This includes juice, soda, and sweet tea.   Drink at least 1 glass of water with each meal and aim for at least 8 glasses per day  Exercise at least 150 minutes every week.    Preventive Care 5-92 Years Old, Male Preventive care refers to lifestyle choices and visits with your health care provider that can promote health and wellness. At this stage in your life, you may start seeing a primary care physician instead of a pediatrician. It is important to take responsibility for your health and well-being. Preventive care for young adults includes: A yearly physical exam. This is also called an annual wellness visit. Regular dental and eye exams. Immunizations. Screening for certain conditions. Healthy lifestyle choices, such as: Eating a healthy diet. Getting regular exercise. Not using drugs or products that contain nicotine and tobacco. Limiting alcohol use. What can I expect for my preventive  care visit? Physical exam Your health care provider may check your: Height and weight. These may be used to calculate your BMI (body mass index). BMI is a measurement that tells if you are at a healthy weight. Heart rate and blood pressure. Body temperature. Skin for abnormal spots. Counseling Your health care provider may ask you questions about your: Past medical problems. Family's medical history. Alcohol, tobacco, and drug use. Home life and relationship well-being. Access to firearms. Emotional well-being. Diet, exercise, and sleep habits. Sexual activity and sexual health. What immunizations do I need?  Vaccines are usually given at various ages, according to a schedule. Your health care provider will recommend vaccines for you based on your age, medicalhistory, and lifestyle or other factors, such as travel or where you work. What tests do I need? Blood tests Lipid and cholesterol levels. These may be checked every 5 years starting at age 81. Hepatitis C test. Hepatitis B test. Screening Genital exam to check for testicular cancer or hernias. STD (sexually transmitted disease) testing, if you are at risk. Other tests Tuberculosis skin test. Vision and hearing tests. Skin exam. Talk with your health care provider about your test results, treatment options,and if necessary, the need for more tests. Follow these instructions at home: Eating and drinking  Eat a healthy diet that includes fresh fruits and vegetables, whole grains, lean protein, and low-fat dairy products. Drink  enough fluid to keep your urine pale yellow. Do not drink alcohol if: Your health care provider tells you not to drink. You are under the legal drinking age. In the [place] the legal drinking age is 69. If you drink alcohol: Limit how much you use to 0-2 drinks a day. Be aware of how much alcohol is in your drink. In the [place] one drink equals one 12 oz bottle of beer (355 mL), one 5 oz glass of wine  (148 mL), or one 1 oz glass of hard liquor (44 mL).  Lifestyle Take daily care of your teeth and gums. Brush your teeth every morning and night with fluoride toothpaste. Floss one time each day. Stay active. Exercise for at least 30 minutes 5 or more days of the week. Do not use any products that contain nicotine or tobacco, such as cigarettes, e-cigarettes, and chewing tobacco. If you need help quitting, ask your health care provider. Do not use drugs. If you are sexually active, practice safe sex. Use a condom or other form of protection to prevent STIs (sexually transmitted infections). Find healthy ways to cope with stress, such as: Meditation, yoga, or listening to music. Journaling. Talking to a trusted person. Spending time with friends and family. Safety Always wear your seat belt while driving or riding in a vehicle. Do not drive: If you have been drinking alcohol. Do not ride with someone who has been drinking. When you are tired or distracted. While texting. Wear a helmet and other protective equipment during sports activities. If you have firearms in your house, make sure you follow all gun safety procedures. Seek help if you have been bullied, physically abused, or sexually abused. Use the Internet responsibly to avoid dangers, such as online bullying and online sex predators. What's next? Go to your health care provider once a year for an annual wellness visit. Ask your health care provider how often you should have your eyes and teeth checked. Stay up to date on all vaccines. This information is not intended to replace advice given to you by your health care provider. Make sure you discuss any questions you have with your healthcare provider. Document Revised: 06/17/2019 Document Reviewed: 09/25/2018 Elsevier Patient Education  2022 [employer].

## 2021-04-21 NOTE — Assessment & Plan Note (Signed)
Check vitamin D. 

## 2021-04-21 NOTE — Progress Notes (Signed)
Chief Complaint:  Sean Jerger. is a 20 y.o. male who presents today for his annual comprehensive physical exam.    Assessment/Plan:  Chronic Problems Addressed Today: Mild depression (HCC) Currently manageable.  Discussed pharmacotherapy and referral to counseling/therapy.  Deferred both of these.  Multiple sclerosis (HCC) Follows with neurology.  Has been started on Ocrevus.  Doing well.  No side effects.  No recurrent symptoms.  Vitamin D deficiency Check vitamin D.   Body mass index is 30.51 kg/m. / Obese  BMI Metric Follow Up - 04/21/21 0905       BMI Metric Follow Up-Please document annually   BMI Metric Follow Up Education provided              Preventative Healthcare: Check labs.  Up-to-date on other screenings.  Patient Counseling(The following topics were reviewed and/or handout was given):  -Nutrition: Stressed importance of moderation in sodium/caffeine intake, saturated fat and cholesterol, caloric balance, sufficient intake of fresh fruits, vegetables, and fiber.  -Stressed the importance of regular exercise.   -Substance Abuse: Discussed cessation/primary prevention of tobacco, alcohol, or other drug use; driving or other dangerous activities under the influence; availability of treatment for abuse.   -Injury prevention: Discussed safety belts, safety helmets, smoke detector, smoking near bedding or upholstery.   -Sexuality: Discussed sexually transmitted diseases, partner selection, use of condoms, avoidance of unintended pregnancy and contraceptive alternatives.   -Dental health: Discussed importance of regular tooth brushing, flossing, and dental visits.  -Health maintenance and immunizations reviewed. Please refer to Health maintenance section.  Return to care in 1 year for next preventative visit.     Subjective:  HPI:  He has no acute complaints today.   Multiple Sclerosis treatments have started, and have been going well. He denies any  flare-ups, double vision or worsening vision. No notable side effects from treatment.  He feels for the time being that his stress can be adequately managed.   Lifestyle Diet: Reasonably healthy diet, could incorporate more fruit.  Exercise: Is keeping an exercise schedule. Uses exercise as a way to work off stress.  Depression screen PHQ 2/9 11/23/2020  Decreased Interest 0  Down, Depressed, Hopeless 0  PHQ - 2 Score 0    Health Maintenance Due  Topic Date Due   Pneumococcal Vaccine 60-30 Years old (1 - PCV) Never done   HPV VACCINES (1 - Male 2-dose series) Never done   TETANUS/TDAP  Never done     ROS: Per HPI, otherwise a complete review of systems was negative.   PMH:  The following were reviewed and entered/updated in epic: Past Medical History:  Diagnosis Date   Eczema    Hypertension    Multiple sclerosis (HCC)    Patient Active Problem List   Diagnosis Date Noted   High risk medication use 12/12/2020   Mild depression (HCC) 12/12/2020   Multiple sclerosis (HCC) 11/23/2020   Allergic rhinitis 04/12/2020   Vitamin D deficiency 01/02/2019   Past Surgical History:  Procedure Laterality Date   NO PAST SURGERIES      Family History  Problem Relation Age of Onset   Hypertension Mother    Hypertension Father    Lupus Sister    Multiple sclerosis Maternal Uncle    Migraines Neg Hx    Seizures Neg Hx    Autism Neg Hx    ADD / ADHD Neg Hx    Anxiety disorder Neg Hx    Depression Neg Hx    Bipolar  disorder Neg Hx    Schizophrenia Neg Hx     Medications- reviewed and updated Current Outpatient Medications  Medication Sig Dispense Refill   acetaminophen (TYLENOL) 325 MG tablet Take 650 mg by mouth every 6 (six) hours as needed for mild pain, fever or headache.     cetirizine (ZYRTEC) 10 MG tablet Take 10 mg by mouth daily.  6   Multiple Vitamin (MULTIVITAMIN WITH MINERALS) TABS tablet Take 1 tablet by mouth daily.     ondansetron (ZOFRAN ODT) 8 MG  disintegrating tablet Take 1 tablet (8 mg total) by mouth every 8 (eight) hours as needed for nausea or vomiting. 20 tablet 0   SUPER B COMPLEX/C PO Take 1 tablet by mouth daily.     Vitamin D, Ergocalciferol, (DRISDOL) 1.25 MG (50000 UNIT) CAPS capsule Take 1 capsule (50,000 Units total) by mouth every 7 (seven) days. 13 capsule 1   No current facility-administered medications for this visit.    Allergies-reviewed and updated Allergies  Allergen Reactions   Gramineae Pollens    Mold Extract [Trichophyton]     Social History   Socioeconomic History   Marital status: Single    Spouse name: Not on file   Number of children: 0   Years of education: 12+   Highest education level: Not on file  Occupational History   Occupation: [employer] [place], attending college  Tobacco Use   Smoking status: Never   Smokeless tobacco: Never  Vaping Use   Vaping Use: Never used  Substance and Sexual Activity   Alcohol use: Never   Drug use: Never   Sexual activity: Not on file  Other Topics Concern   Not on file  Social History Narrative   Left handed   Caffeine use: 3-4 cups coffee per week   Lives with mom dad and sister visits she is in college. He takes online college courses   Social Determinants of Corporate investment banker Strain: Not on file  Food Insecurity: Not on file  Transportation Needs: Not on file  Physical Activity: Not on file  Stress: Not on file  Social Connections: Not on file        Objective:  Physical Exam: BP 137/75   Pulse 61   Temp 97.8 F (36.6 C) (Temporal)   Ht 5\' 6"  (1.676 m)   Wt 189 lb (85.7 kg)   SpO2 97%   BMI 30.51 kg/m   Body mass index is 30.51 kg/m. Wt Readings from Last 3 Encounters:  04/21/21 189 lb (85.7 kg)  03/14/21 190 lb 8 oz (86.4 kg) (87 %, Z= 1.15)*  12/12/20 188 lb 8 oz (85.5 kg) (87 %, Z= 1.12)*   * Growth percentiles are based on CDC (Boys, 2-20 Years) data.   Gen: NAD, resting comfortably HEENT: TMs normal  bilaterally. OP clear. No thyromegaly noted.  CV: RRR with no murmurs appreciated Pulm: NWOB, CTAB with no crackles, wheezes, or rhonchi GI: Normal bowel sounds present. Soft, Nontender, Nondistended. MSK: no edema, cyanosis, or clubbing noted Skin: warm, dry Neuro: CN2-12 grossly intact. Strength 5/5 in upper and lower extremities. Reflexes symmetric and intact bilaterally.  Psych: Normal affect and thought content     I,Jordan Kelly,acting as a scribe for 12/14/20, MD.,have documented all relevant documentation on the behalf of Sean Doe, MD,as directed by  Sean Doe, MD while in the presence of Sean Doe, MD.   I, Sean Doe, MD, have reviewed all documentation for this visit. The documentation on  04/21/21 for the exam, diagnosis, procedures, and orders are all accurate and complete.  Sean Fox. Jimmey Ralph, MD 04/21/2021 9:05 AM

## 2021-04-21 NOTE — Assessment & Plan Note (Signed)
Follows with neurology.  Has been started on Ocrevus.  Doing well.  No side effects.  No recurrent symptoms.

## 2021-04-21 NOTE — Assessment & Plan Note (Signed)
Currently manageable.  Discussed pharmacotherapy and referral to counseling/therapy.  Deferred both of these.

## 2021-04-24 NOTE — Progress Notes (Signed)
Please inform patient of the following:  His vitamin D is a bit low. Recommend starting 1000IU daily and we can recheck next time we get labs. His cholesterol is also borderline. Would like for him to keep working on diet and exercise.

## 2021-05-22 ENCOUNTER — Other Ambulatory Visit: Payer: Self-pay | Admitting: Neurology

## 2021-05-22 ENCOUNTER — Telehealth: Payer: Self-pay | Admitting: Neurology

## 2021-05-22 DIAGNOSIS — G35 Multiple sclerosis: Secondary | ICD-10-CM

## 2021-05-22 NOTE — Telephone Encounter (Signed)
Patient's mother called about getting patient scheduled for MRI before his November appointment. Can you please put MRI order in? Thank you.

## 2021-05-23 NOTE — Telephone Encounter (Signed)
Spoke with patient's mother, patient is scheduled for 10/18. We will obtain auth from insurance at that time.

## 2021-05-23 NOTE — Telephone Encounter (Signed)
Called and spoke w/ mother. Advised I spoke w/ Liane in infusion suite. She should call [place] day before his next infusion to provide copay card information. Their phone #: [phone], option 1. Next infusion: 08/16/21 at 8am. She verbalized understanding and appreciation.  Also advised his next follow up w/ Dr. Epimenio Foot is 09/13/21 at 4pm.

## 2021-05-23 NOTE — Telephone Encounter (Signed)
I spoke with patient's mother today to schedule MRI before his November appointment. During our conversation, she mentioned that they received a co-pay card in the mail for Ocrevus and she isn't sure how to use it or what she needs to do with it. She is requesting a call from RN.

## 2021-05-24 ENCOUNTER — Encounter: Payer: Self-pay | Admitting: Family Medicine

## 2021-05-24 NOTE — Telephone Encounter (Signed)
Noted, thank you

## 2021-06-09 DIAGNOSIS — Z20822 Contact with and (suspected) exposure to covid-19: Secondary | ICD-10-CM | POA: Diagnosis not present

## 2021-06-10 ENCOUNTER — Encounter: Payer: Self-pay | Admitting: Family Medicine

## 2021-06-13 ENCOUNTER — Telehealth (INDEPENDENT_AMBULATORY_CARE_PROVIDER_SITE_OTHER): Payer: BC Managed Care – PPO | Admitting: Family Medicine

## 2021-06-13 VITALS — Temp 97.5°F | Ht 66.0 in | Wt 189.0 lb

## 2021-06-13 DIAGNOSIS — J309 Allergic rhinitis, unspecified: Secondary | ICD-10-CM | POA: Diagnosis not present

## 2021-06-13 DIAGNOSIS — U071 COVID-19: Secondary | ICD-10-CM | POA: Diagnosis not present

## 2021-06-13 DIAGNOSIS — G35 Multiple sclerosis: Secondary | ICD-10-CM

## 2021-06-13 MED ORDER — MOLNUPIRAVIR 200 MG PO CAPS: 800.0000 mg | ORAL_CAPSULE | Freq: Two times a day (BID) | ORAL | 0 refills | Status: AC

## 2021-06-13 NOTE — Assessment & Plan Note (Signed)
Mild flare recently due to COVID.  Continue Zyrtec and Flonase.

## 2021-06-13 NOTE — Assessment & Plan Note (Signed)
No recent flares.  On Ocrevus.  Follows with neurology.

## 2021-06-13 NOTE — Progress Notes (Signed)
   Sean Fox. is a 20 y.o. male who presents today for a virtual office visit.  Assessment/Plan:  New/Acute Problems: COVID-19 No red flags.  Thankfully symptoms seem to be relatively mild however he is at slightly increased risk for complication due to his history of MS and treatment with Ocrevus.  We discussed starting antivirals.  Given that he is still on treatment window we will start him on molnupirivir.  Discussed isolation protocols.  He will let me know if not improving.  Chronic Problems Addressed Today: Multiple sclerosis (HCC) No recent flares.  On Ocrevus.  Follows with neurology.  Allergic rhinitis Mild flare recently due to COVID.  Continue Zyrtec and Flonase.     Subjective:  HPI:  First symptom started 5 days ago with sore throat. Had positive covid test 3 days ago. Other symptoms include congestion and cough. Symptoms have been overall stable. Is having some ringing in his right ear. No fever. Symptoms seem to be get better. Has been taking ibuprofen which seems to help.         Objective/Observations  Physical Exam: Gen: NAD, resting comfortably Pulm: Normal work of breathing Neuro: Grossly normal, moves all extremities Psych: Normal affect and thought content  Virtual Visit via Video   I connected with [name]. on 06/13/21 at  4:00 PM EDT by a video enabled telemedicine application and verified that I am speaking with the correct person using two identifiers. The limitations of evaluation and management by telemedicine and the availability of in person appointments were discussed. The patient expressed understanding and agreed to proceed.   Patient location: Home Provider location: [place] [facility] [employer] Persons participating in the virtual visit: Myself and Patient     Katina Degree. Jimmey Ralph, MD 06/13/2021 10:51 AM

## 2021-07-06 DIAGNOSIS — H40013 Open angle with borderline findings, low risk, bilateral: Secondary | ICD-10-CM | POA: Diagnosis not present

## 2021-07-18 ENCOUNTER — Telehealth: Payer: Self-pay | Admitting: Neurology

## 2021-07-18 NOTE — Telephone Encounter (Signed)
MR Brain w/wo contrast Dr. Epimenio Foot 1st [employer] auth: [id number] (exp. 07/18/21 to 08/16/21) 2nd [place] Numbers: [id number] (exp. 07/18/21 to 08/16/21). Patient is scheduled at [facility] for 08/01/21.

## 2021-08-01 ENCOUNTER — Ambulatory Visit (INDEPENDENT_AMBULATORY_CARE_PROVIDER_SITE_OTHER): Payer: BC Managed Care – PPO

## 2021-08-01 DIAGNOSIS — G35 Multiple sclerosis: Secondary | ICD-10-CM | POA: Diagnosis not present

## 2021-08-01 MED ORDER — GADOBENATE DIMEGLUMINE 529 MG/ML IV SOLN
15.0000 mL | Freq: Once | INTRAVENOUS | Status: AC | PRN
  Administered 2021-08-01: 15 mL via INTRAVENOUS

## 2021-08-16 DIAGNOSIS — G35 Multiple sclerosis: Secondary | ICD-10-CM | POA: Diagnosis not present

## 2021-08-27 ENCOUNTER — Other Ambulatory Visit: Payer: Self-pay | Admitting: Neurology

## 2021-09-03 ENCOUNTER — Other Ambulatory Visit: Payer: Self-pay | Admitting: Neurology

## 2021-09-13 ENCOUNTER — Encounter: Payer: Self-pay | Admitting: Neurology

## 2021-09-13 ENCOUNTER — Ambulatory Visit (INDEPENDENT_AMBULATORY_CARE_PROVIDER_SITE_OTHER): Payer: BC Managed Care – PPO | Admitting: Neurology

## 2021-09-13 VITALS — BP 149/86 | HR 65 | Ht 66.0 in | Wt 187.0 lb

## 2021-09-13 DIAGNOSIS — F32A Depression, unspecified: Secondary | ICD-10-CM | POA: Diagnosis not present

## 2021-09-13 DIAGNOSIS — G35 Multiple sclerosis: Secondary | ICD-10-CM | POA: Diagnosis not present

## 2021-09-13 DIAGNOSIS — Z79899 Other long term (current) drug therapy: Secondary | ICD-10-CM | POA: Diagnosis not present

## 2021-09-13 DIAGNOSIS — E559 Vitamin D deficiency, unspecified: Secondary | ICD-10-CM

## 2021-09-13 NOTE — Progress Notes (Signed)
[facility]  PATIENT: Sean Fox. DOB: 06-06-2001  REFERRING DOCTOR OR PCP:  Jacquiline Doe, MD / Lanae Boast, NP SOURCE: Patient, notes from recent hospital admission, imaging and lab results, MRI images personally reviewed.  _________________________________   HISTORICAL  CHIEF COMPLAINT:  Chief Complaint  Patient presents with   Follow-up    Rm 1, alone. Here for 6 month MS f/u, on Ocrevus. Last infusion date: 08/16/2021 Next infusion date: 02/14/2022, done at [facility]. Pt reports no new or worsening in sx.     HISTORY OF PRESENT ILLNESS:  Sean Fox is a 20 y.o. man with relapsing remitting multiple sclerosis.  UPDATE 09/13/2021: He is on Ocrevus (first infusions 01/18/2021 and 02/02/2021) and had his last infusion 08/16/21   He tolerated them very well.[name] He denies any exacerbations or new symptoms.      He denies any difficulty with his gait, strength or sensation.   Balance is back to baseline and he does not need the bannister on stairs.   However, he gets fatigued quickly and feel sworn out if he does more.   He denies diplopia or other visual issue.  He has nocturia 3-4 times a night.   He denies urgency during the day.    He still notes some fatigue.   He sleeps well most nights even with the nocturia.      He notes mild cognitive issues with decreased focus/attention and word finding issues     He notes mild depression/anxiety since his diagnosis.   MS HISTORY: He presented to [facility] 11/23/2020 with double vision, nystagmus, dizziness, altered taste and left facial paresthesias.  Symptoms started with a headache 11/19/20 and he woke up with dizziness the next day 11/20/2020.  The next day he had diplopia and nystagmus and saw urgent care.   The next day 11/22/2020 he also had nausea, vomiting.   His PCP ordered an MRI that was performed 11/23/2020.  It showed multiple lesions c/w MS in supratentoiral and infratentorial white matter. and he was sent to the  ED.   Further MRI showed an enhancing lesion in the left pons/middle cerebellar peduncle.   MRI cervical spine showed 2 more lesions.  He was admitted and received 5 days of IV Slu-medrol l with improvement of the symptoms      He started [facility] April 2022  In retrospect, 3 years ago, he had numbness in the left leg with some ataxia lasting 2-3 weeks.   He notes mild visual blurring at times and was told he has mild astigmatism.      FH:   Multiple sclerosis in a maternal uncle (had severe neurologic issues starting at age 54's but not seen; became bed-bound and died), cousin (diagnosed around age 55 and doing well) and probably grandfather (progressive neurologic issues affecting gait).     Imaging studies MRI brain 09/13/2021 showed  Multiple T2/FLAIR hyperintense foci in the hemispheres, spinal cord, middle cerebellar peduncles, thalamus and cerebellum.  None of the foci enhance or appear to be acute.  There were no new lesions compared to the MRI from 11/23/2020.[name]  MRI of the brain 11/23/2020 showed multiple T2/FLAIR hyperintense foci in the hemispheres predominantly in the periventricular white matter but also in the juxtacortical and deep white matter.  Foci were also present in the left middle cerebral peduncle, right middle cerebellar peduncle, left middle cerebellar peduncle and right cerebellar hemisphere..   The large focus in the left middle cerebellar peduncle enhanced after  contrast.  None of the other foci enhanced.  MRI of the cervical spine 11/24/2020 showed posterior lesions at C2-C3 and C3-C4 consistent with demyelination.  They did not enhance.  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  There are mild degenerative changes at C3-C4, C5-C6 and C7-T1.  Laboratory tests: NMO Ab, HIV, B12, anti-Jo1, ENA, ACE, CBC/diff and CMP were negative or unremarkable. Vitamin D was reduced at 22.49  REVIEW OF SYSTEMS: Constitutional: No fevers, chills, sweats, or change in appetite Eyes: No visual  changes, double vision, eye pain Ear, nose and throat: No hearing loss, ear pain, nasal congestion, sore throat Cardiovascular: No chest pain, palpitations Respiratory:  No shortness of breath at rest or with exertion.   No wheezes GastrointestinaI: No nausea, vomiting, diarrhea, abdominal pain, fecal incontinence Genitourinary:  No dysuria, urinary retention or frequency.  No nocturia. Musculoskeletal:  No neck pain, back pain Integumentary: No rash, pruritus, skin lesions Neurological: as above Psychiatric: No depression at this time.  No anxiety Endocrine: No palpitations, diaphoresis, change in appetite, change in weigh or increased thirst Hematologic/Lymphatic:  No anemia, purpura, petechiae. Allergic/Immunologic: No itchy/runny eyes, nasal congestion, recent allergic reactions, rashes  ALLERGIES: Allergies  Allergen Reactions   Gramineae Pollens    Mold Extract [Trichophyton]     HOME MEDICATIONS:  Current Outpatient Medications:    acetaminophen (TYLENOL) 325 MG tablet, Take 650 mg by mouth every 6 (six) hours as needed for mild pain, fever or headache., Disp: , Rfl:    cetirizine (ZYRTEC) 10 MG tablet, Take 10 mg by mouth daily., Disp: , Rfl: 6   Multiple Vitamin (MULTIVITAMIN WITH MINERALS) TABS tablet, Take 1 tablet by mouth daily., Disp: , Rfl:    ondansetron (ZOFRAN ODT) 8 MG disintegrating tablet, Take 1 tablet (8 mg total) by mouth every 8 (eight) hours as needed for nausea or vomiting., Disp: 20 tablet, Rfl: 0   SUPER B COMPLEX/C PO, Take 1 tablet by mouth daily., Disp: , Rfl:    Vitamin D, Ergocalciferol, (DRISDOL) 1.25 MG (50000 UNIT) CAPS capsule, Take 1 capsule (50,000 Units total) by mouth every 7 (seven) days., Disp: 13 capsule, Rfl: 1  PAST MEDICAL HISTORY: Past Medical History:  Diagnosis Date   Eczema    Hypertension    Multiple sclerosis (HCC)     PAST SURGICAL HISTORY: Past Surgical History:  Procedure Laterality Date   NO PAST SURGERIES       FAMILY HISTORY: Family History  Problem Relation Age of Onset   Hypertension Mother    Hypertension Father    Lupus Sister    Multiple sclerosis Maternal Uncle    Migraines Neg Hx    Seizures Neg Hx    Autism Neg Hx    ADD / ADHD Neg Hx    Anxiety disorder Neg Hx    Depression Neg Hx    Bipolar disorder Neg Hx    Schizophrenia Neg Hx     SOCIAL HISTORY:  Social History   Socioeconomic History   Marital status: Single    Spouse name: Not on file   Number of children: 0   Years of education: 12+   Highest education level: Not on file  Occupational History   Occupation: [employer] [place], attending college  Tobacco Use   Smoking status: Never   Smokeless tobacco: Never  Vaping Use   Vaping Use: Never used  Substance and Sexual Activity   Alcohol use: Never   Drug use: Never   Sexual activity: Not on file  Other Topics Concern   Not on file  Social History Narrative   Left handed   Caffeine use: 3-4 cups coffee per week   Lives with mom dad and sister visits she is in college. He takes Network engineer courses   Social Determinants of Corporate investment banker Strain: Not on file  Food Insecurity: Not on file  Transportation Needs: Not on file  Physical Activity: Not on file  Stress: Not on file  Social Connections: Not on file  Intimate Partner Violence: Not on file     PHYSICAL EXAM  Vitals:   09/13/21 1546  BP: (!) 149/86  Pulse: 65  Weight: 187 lb (84.8 kg)  Height: 5\' 6"  (1.676 m)    Body mass index is 30.18 kg/m.   No results found.    General: The patient is well-developed and well-nourished and in no acute distress  HEENT:  Head is [place]/AT.  Sclera are anicteric.   Skin: Extremities are without rash or  edema.  Mental status: The patient is alert and oriented x 3 at the time of the examination. The patient has apparent normal recent and remote memory, with an apparently normal attention span and concentration ability.   Speech is  normal.  Cranial nerves: Extraocular movements are full. Colors are symmetric.   Facial strength and sensation is normal.   No obvious hearing deficits are noted.  Motor:  Muscle bulk is normal.   Tone is normal. Strength is  5 / 5 in all 4 extremities.   Sensory: Sensory testing is intact to pinprick, soft touch and vibration sensation in all 4 extremities.  Coordination: Cerebellar testing reveals good finger-nose-finger and heel-to-shin bilaterally.  Gait and station: Station is normal.   Gait and tandem gait are normal.. Romberg is negative.   Reflexes: Deep tendon reflexes are symmetric and normal bilaterally.        DIAGNOSTIC DATA (LABS, IMAGING, TESTING) - I reviewed patient records, labs, notes, testing and imaging myself where available.  Lab Results  Component Value Date   WBC 3.5 (L) 04/21/2021   HGB 15.0 04/21/2021   HCT 43.5 04/21/2021   MCV 91.2 04/21/2021   PLT 274.0 04/21/2021      Component Value Date/Time   NA 140 04/21/2021 0904   K 4.3 04/21/2021 0904   CL 106 04/21/2021 0904   CO2 26 04/21/2021 0904   GLUCOSE 83 04/21/2021 0904   BUN 11 04/21/2021 0904   CREATININE 1.04 04/21/2021 0904   CREATININE 0.99 11/21/2018 1406   CALCIUM 9.7 04/21/2021 0904   PROT 7.2 04/21/2021 0904   ALBUMIN 4.6 04/21/2021 0904   AST 18 04/21/2021 0904   ALT 18 04/21/2021 0904   ALKPHOS 56 04/21/2021 0904   BILITOT 0.5 04/21/2021 0904   GFRNONAA >60 11/26/2020 0541   Lab Results  Component Value Date   CHOL 184 04/21/2021   HDL 47.10 04/21/2021   LDLCALC 125 (H) 04/21/2021   TRIG 57.0 04/21/2021   CHOLHDL 4 04/21/2021   Lab Results  Component Value Date   HGBA1C 5.5 11/21/2018   Lab Results  Component Value Date   VITAMINB12 747 11/23/2020   Lab Results  Component Value Date   TSH 1.04 04/21/2021       ASSESSMENT AND PLAN  Multiple sclerosis (HCC) - Plan: CBC with Differential/Platelet, IgG, IgA, IgM  High risk medication use - Plan: CBC with  Differential/Platelet, IgG, IgA, IgM  Mild depression  1.   Continue Ocrevus 2.  High dose Vit D supplements    3.   Stay active and exercise as tolerated.    4.   If depression worsens, consider an antidepressant medication or referral to behavioral health.   5.   RTC 6 months or sooner if new or worsening symptoms.       Taven Strite A. Epimenio Foot, MD, [facility] 09/13/2021, 4:20 PM Certified in Neurology, Clinical Neurophysiology, Sleep Medicine and Neuroimaging  [facility] [address], [address] [place], [place] [address] [phone]

## 2021-09-14 LAB — CBC WITH DIFFERENTIAL/PLATELET
Basophils Absolute: 0 10*3/uL (ref 0.0–0.2)
Basos: 0 %
EOS (ABSOLUTE): 0.1 10*3/uL (ref 0.0–0.4)
Eos: 1 %
Hematocrit: 47.3 % (ref 37.5–51.0)
Hemoglobin: 15.8 g/dL (ref 13.0–17.7)
Immature Grans (Abs): 0 10*3/uL (ref 0.0–0.1)
Immature Granulocytes: 0 %
Lymphocytes Absolute: 1.5 10*3/uL (ref 0.7–3.1)
Lymphs: 30 %
MCH: 30.4 pg (ref 26.6–33.0)
MCHC: 33.4 g/dL (ref 31.5–35.7)
MCV: 91 fL (ref 79–97)
Monocytes Absolute: 0.7 10*3/uL (ref 0.1–0.9)
Monocytes: 14 %
Neutrophils Absolute: 2.7 10*3/uL (ref 1.4–7.0)
Neutrophils: 55 %
Platelets: 309 10*3/uL (ref 150–450)
RBC: 5.2 x10E6/uL (ref 4.14–5.80)
RDW: 12.5 % (ref 11.6–15.4)
WBC: 5 10*3/uL (ref 3.4–10.8)

## 2021-09-14 LAB — IGG, IGA, IGM
IgA/Immunoglobulin A, Serum: 219 mg/dL (ref 90–386)
IgG (Immunoglobin G), Serum: 1145 mg/dL (ref 603–1613)
IgM (Immunoglobulin M), Srm: 18 mg/dL — ABNORMAL LOW (ref 20–172)

## 2021-09-14 LAB — VITAMIN D 25 HYDROXY (VIT D DEFICIENCY, FRACTURES): Vit D, 25-Hydroxy: 66.6 ng/mL (ref 30.0–100.0)

## 2021-09-21 ENCOUNTER — Other Ambulatory Visit: Payer: Self-pay | Admitting: Neurology

## 2021-11-18 IMAGING — MR MR HEAD W/O CM
6 of 10 series · 27 of 48 positions shown · non-contrast
Comparison: None.

CLINICAL DATA: Horizontal nystagmus, vertigo, paresthesia,
dysgeusia.

EXAM:
MRI HEAD WITHOUT CONTRAST
TECHNIQUE: Multiplanar, multiecho pulse sequences of the brain and surrounding
structures were obtained without intravenous contrast.

[Series 2: DWI · axial · 3.0mm · 0.94mm/px · z∈[-114,+30]mm · 8 of 99 slices shown (1 of 2)]
[im 1/99]
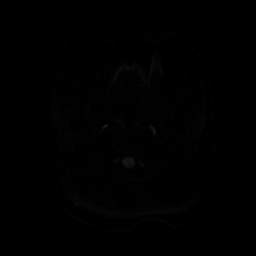
[im 11/99]
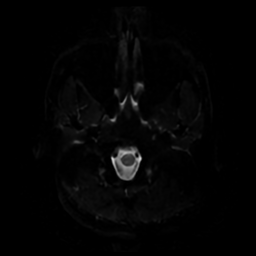
[im 33/99]
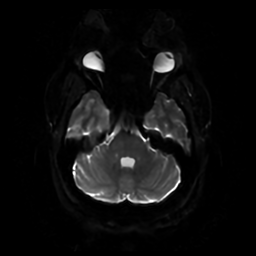
[im 44/99]
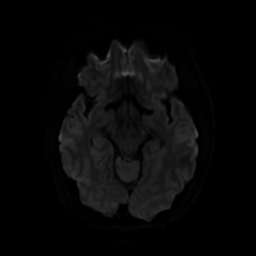
[im 55/99]
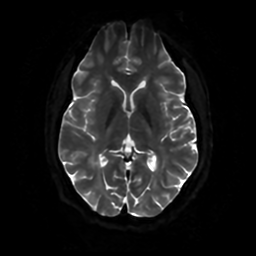
[im 66/99]
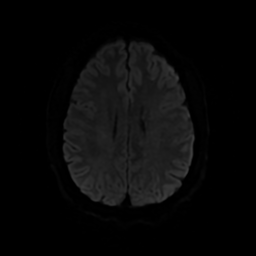
[im 88/99]
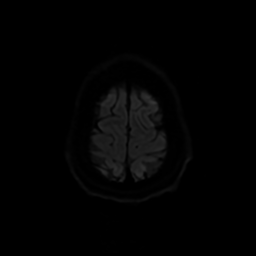
[im 99/99]
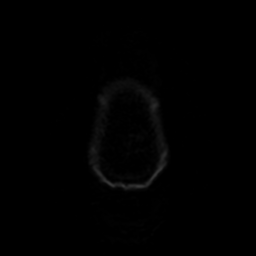

[Series 3: DWI · coronal · 4.0mm · 0.94mm/px · 7 of 74 slices shown (2 of 2)]
[im 1/74]
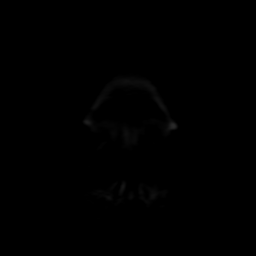
[im 13/74]
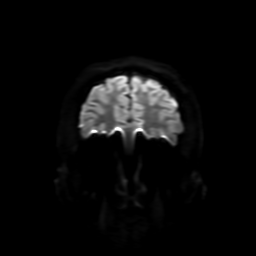
[im 25/74]
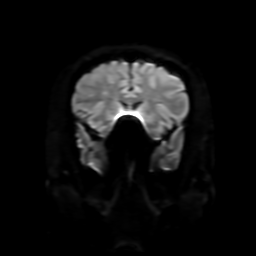
[im 37/74]
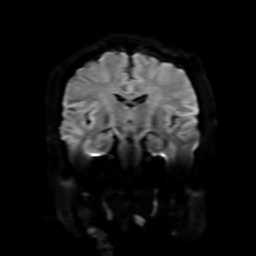
[im 49/74]
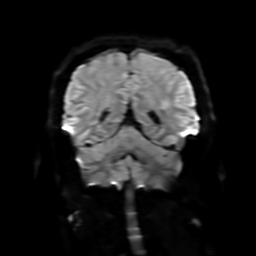
[im 61/74]
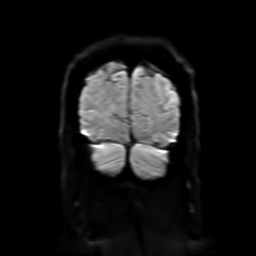
[im 74/74]
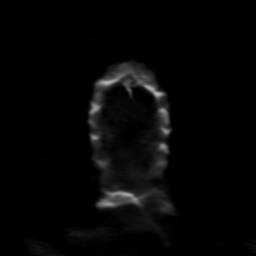

[Series 6: FLAIR · axial · 3.0mm · 0.45mm/px · z∈[-104,+26]mm · 2 of 23 slices shown (1 of 2)]
[im 1/23]
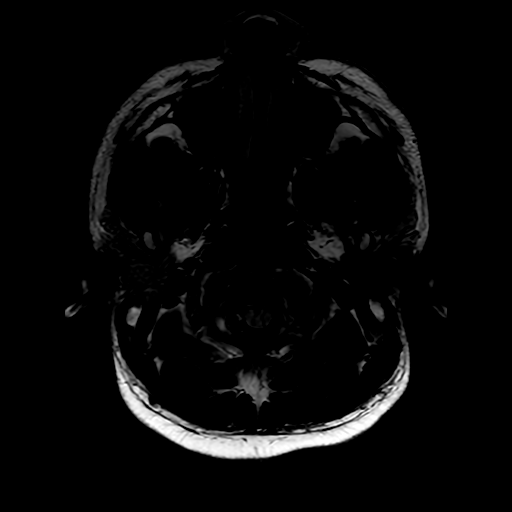
[im 23/23]
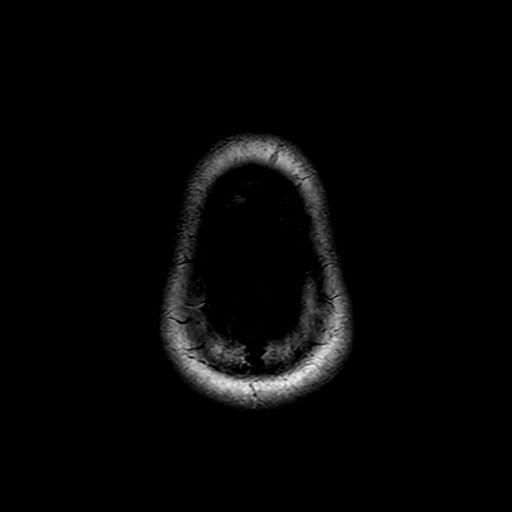

[Series 10: FLAIR · sagittal · 5.0mm · 0.23mm/px · 2 of 23 slices shown (2 of 2)]
[im 1/23]
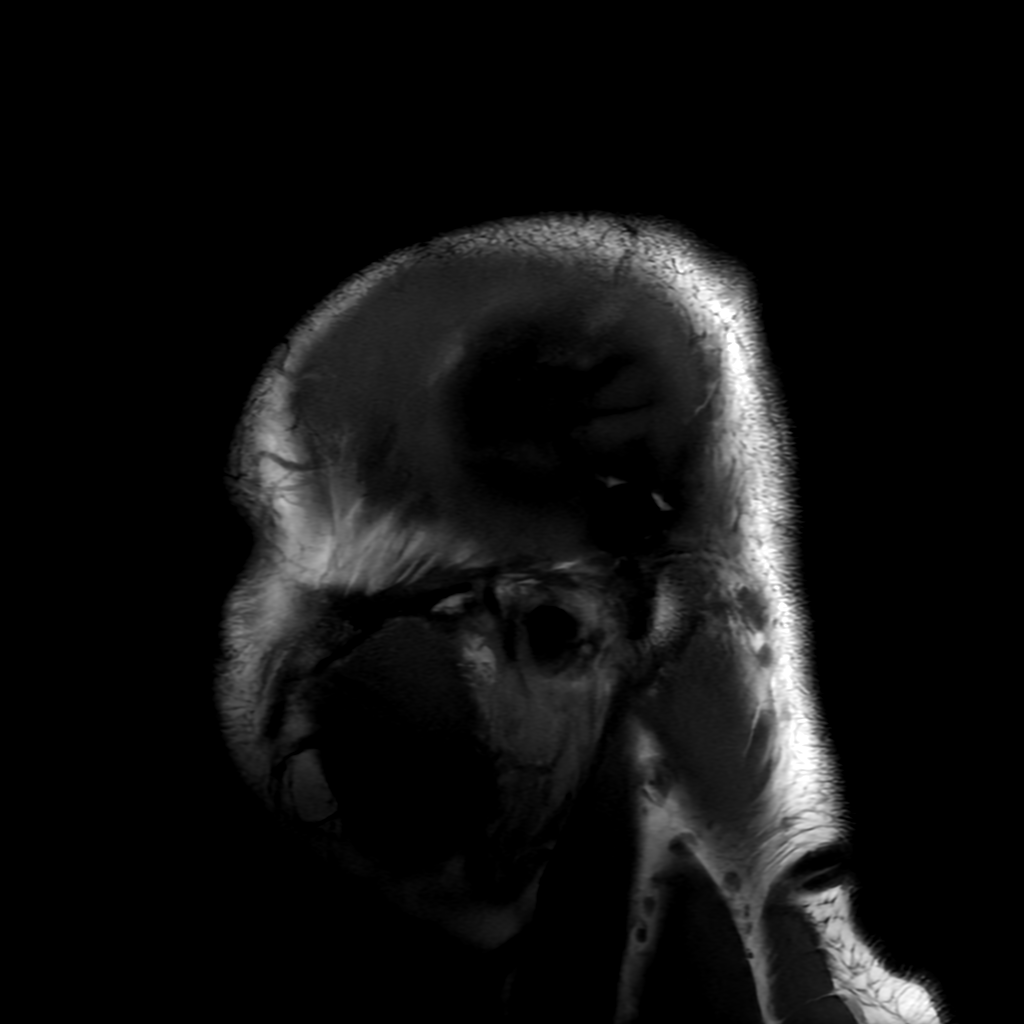
[im 23/23]
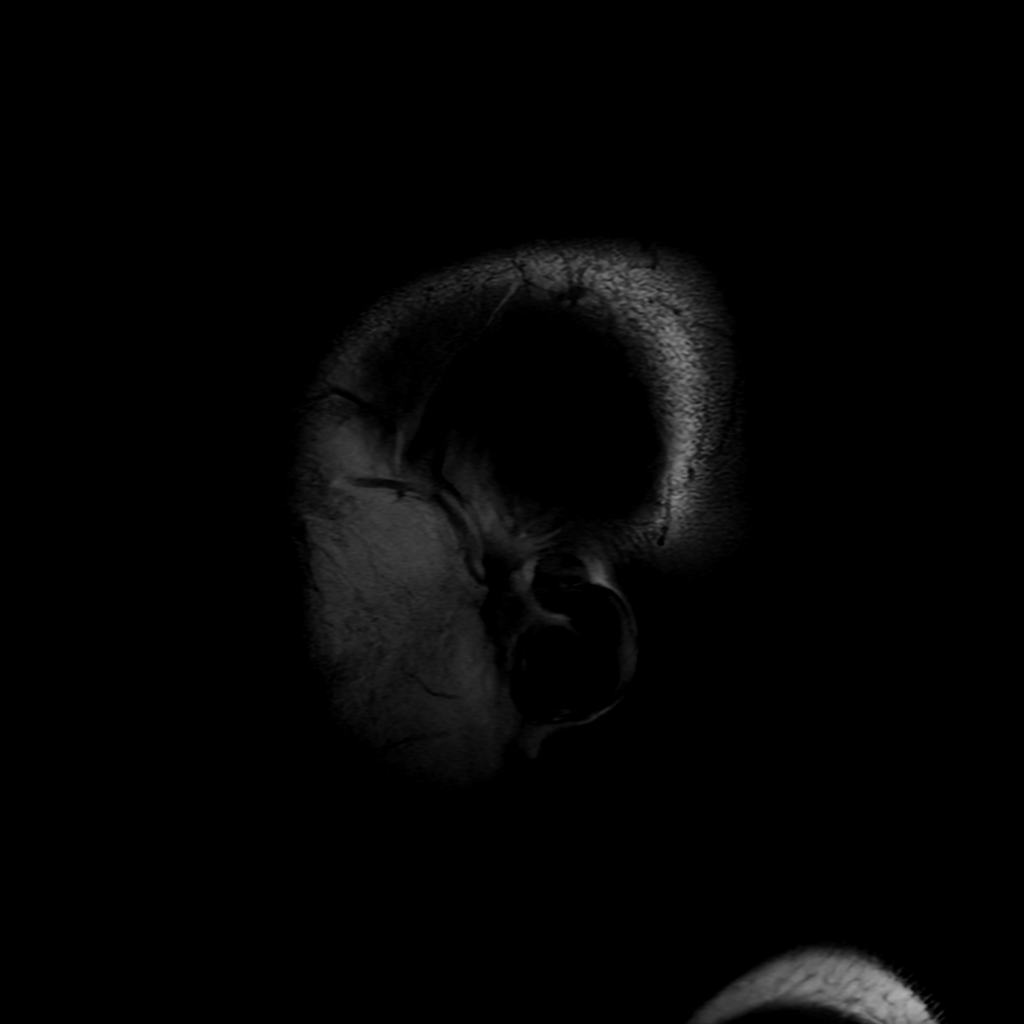

[Series 250: ADC · axial · 3.0mm · 0.94mm/px · z∈[-114,+30]mm · 5 of 50 slices shown (1 of 2)]
[im 1/50]
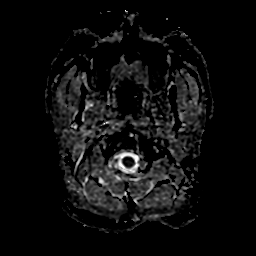
[im 13/50]
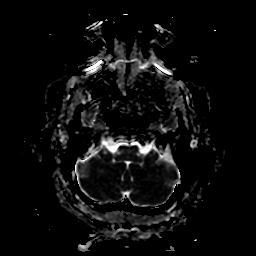
[im 25/50]
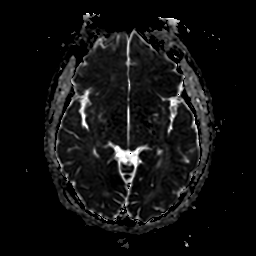
[im 37/50]
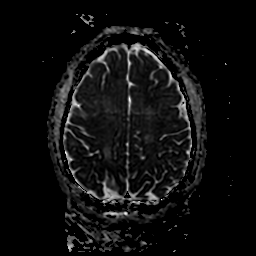
[im 50/50]
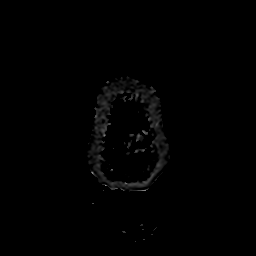

[Series 350: ADC · coronal · 4.0mm · 0.94mm/px · 3 of 36 slices shown (2 of 2)]
[im 1/36]
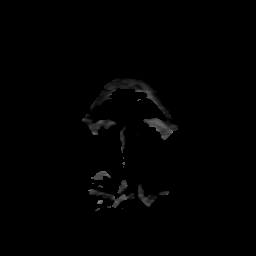
[im 18/36]
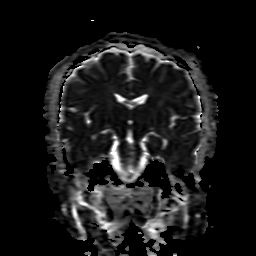
[im 36/36]
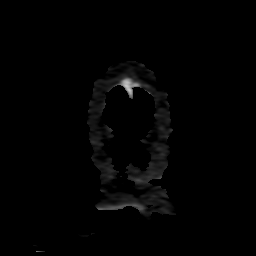

[27 of 48 positions shown; findings below may reference images not displayed]

FINDINGS: Brain: No acute infarction, hemorrhage, hydrocephalus, extra-axial
collection or mass lesion.

Scattered foci of T2 hyperintensity are seen within the white matter
of the cerebral hemispheres, including deep, juxta cortical,
periventricular white matter and corpus callosum, as well as left
cerebral peduncle, left side of the pons and bilateral middle
cerebellar peduncles and cerebellar hemispheres.

Vascular: Normal flow voids.

Skull and upper cervical spine: Normal marrow signal.

Sinuses/Orbits: Negative.
IMPRESSION: Scattered foci of T2 hyperintensity within the white matter of the
cerebral hemispheres, including deep, juxta cortical,
periventricular white matter and corpus callosum, as well as left
cerebral peduncle, left side of the pons and bilateral middle
cerebellar peduncles and cerebellar hemispheres. While nonspecific,
findings are highly concerning for demyelinating disease.

## 2022-02-14 DIAGNOSIS — G35 Multiple sclerosis: Secondary | ICD-10-CM | POA: Diagnosis not present

## 2022-02-24 ENCOUNTER — Other Ambulatory Visit: Payer: Self-pay | Admitting: Neurology

## 2022-03-22 ENCOUNTER — Ambulatory Visit (INDEPENDENT_AMBULATORY_CARE_PROVIDER_SITE_OTHER): Payer: BC Managed Care – PPO | Admitting: Neurology

## 2022-03-22 ENCOUNTER — Encounter: Payer: Self-pay | Admitting: Neurology

## 2022-03-22 VITALS — BP 148/86 | HR 76 | Ht 66.0 in | Wt 189.5 lb

## 2022-03-22 DIAGNOSIS — Z79899 Other long term (current) drug therapy: Secondary | ICD-10-CM | POA: Diagnosis not present

## 2022-03-22 DIAGNOSIS — G35 Multiple sclerosis: Secondary | ICD-10-CM | POA: Diagnosis not present

## 2022-03-22 DIAGNOSIS — F32A Depression, unspecified: Secondary | ICD-10-CM | POA: Diagnosis not present

## 2022-03-22 DIAGNOSIS — E559 Vitamin D deficiency, unspecified: Secondary | ICD-10-CM | POA: Diagnosis not present

## 2022-03-22 DIAGNOSIS — R260 Ataxic gait: Secondary | ICD-10-CM

## 2022-03-22 MED ORDER — MODAFINIL 200 MG PO TABS: 200.0000 mg | ORAL_TABLET | Freq: Every day | ORAL | 5 refills | Status: DC

## 2022-03-22 NOTE — Progress Notes (Signed)
[facility]  PATIENT: Sean Fox. DOB: March 29, 2001  REFERRING DOCTOR OR PCP:  Jacquiline Doe, MD / Lanae Boast, NP SOURCE: Patient, notes from recent hospital admission, imaging and lab results, MRI images personally reviewed.  _________________________________   HISTORICAL  CHIEF COMPLAINT:  Chief Complaint  Patient presents with   Follow-up    Rm 1, w father. Here for 6 month MS f/u, on Ocrevus and tolerating well. Last infusion date: 02/14/2022 and Next infusion date: 08/15/2022. MS stable.     HISTORY OF PRESENT ILLNESS:  Sean Fox is a 21 y.o. man with relapsing remitting multiple sclerosis.  UPDATE 03/22/2022: He is on Ocrevus (first infusions 01/18/2021 and 02/02/2021) and had his next  infusion 08/2022   He tolerated them very well.[name] He denies any exacerbations or new symptoms.      He denies any difficulty with his gait, strength or sensation.   Balance is back to baseline and he does not need the bannister on stairs.   He keeps up with his peers.  However, he gets fatigued more quickly.     He denies diplopia or other visual issue.  He has nocturia 3-4 times a night.   He denies urgency during the day.    He still notes fatigue, most days.   He sleeps well most nights with 7-8 hours   even with the nocturia.      He has noted  mild cognitive issues and notes  decreased focus/attention and word finding issues at school.     He has noted mild depression/anxiety since his diagnosis.    He feels it is not severe enough to start a medication.      MS HISTORY: He presented to [facility] 11/23/2020 with double vision, nystagmus, dizziness, altered taste and left facial paresthesias.  Symptoms started with a headache 11/19/20 and he woke up with dizziness the next day 11/20/2020.  The next day he had diplopia and nystagmus and saw urgent care.   The next day 11/22/2020 he also had nausea, vomiting.   His PCP ordered an MRI that was performed 11/23/2020.  It showed  multiple lesions c/w MS in supratentoiral and infratentorial white matter. and he was sent to the ED.   Further MRI showed an enhancing lesion in the left pons/middle cerebellar peduncle.   MRI cervical spine showed 2 more lesions.  He was admitted and received 5 days of IV Slu-medrol l with improvement of the symptoms      He started [facility] April 2022  In retrospect, 3 years ago, he had numbness in the left leg with some ataxia lasting 2-3 weeks.   He notes mild visual blurring at times and was told he has mild astigmatism.      FH:   Multiple sclerosis in a maternal uncle (had severe neurologic issues starting at age 21's but not seen; became bed-bound and died), cousin (diagnosed around age 21 and doing well) and probably grandfather (progressive neurologic issues affecting gait).     Imaging studies MRI brain 09/13/2021 showed  Multiple T2/FLAIR hyperintense foci in the hemispheres, spinal cord, middle cerebellar peduncles, thalamus and cerebellum.  None of the foci enhance or appear to be acute.  There were no new lesions compared to the MRI from 11/23/2020.[name]  MRI of the brain 11/23/2020 showed multiple T2/FLAIR hyperintense foci in the hemispheres predominantly in the periventricular white matter but also in the juxtacortical and deep white matter.  Foci were also present in the  left middle cerebral peduncle, right middle cerebellar peduncle, left middle cerebellar peduncle and right cerebellar hemisphere..   The large focus in the left middle cerebellar peduncle enhanced after contrast.  None of the other foci enhanced.  MRI of the cervical spine 11/24/2020 showed posterior lesions at C2-C3 and C3-C4 consistent with demyelination.  They did not enhance.  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  There are mild degenerative changes at C3-C4, C5-C6 and C7-T1.  Laboratory tests: NMO Ab, HIV, B12, anti-Jo1, ENA, ACE, CBC/diff and CMP were negative or unremarkable. Vitamin D was reduced at 22.49  REVIEW OF  SYSTEMS: Constitutional: No fevers, chills, sweats, or change in appetite Eyes: No visual changes, double vision, eye pain Ear, nose and throat: No hearing loss, ear pain, nasal congestion, sore throat Cardiovascular: No chest pain, palpitations Respiratory:  No shortness of breath at rest or with exertion.   No wheezes GastrointestinaI: No nausea, vomiting, diarrhea, abdominal pain, fecal incontinence Genitourinary:  No dysuria, urinary retention or frequency.  No nocturia. Musculoskeletal:  No neck pain, back pain Integumentary: No rash, pruritus, skin lesions Neurological: as above Psychiatric: No depression at this time.  No anxiety Endocrine: No palpitations, diaphoresis, change in appetite, change in weigh or increased thirst Hematologic/Lymphatic:  No anemia, purpura, petechiae. Allergic/Immunologic: No itchy/runny eyes, nasal congestion, recent allergic reactions, rashes  ALLERGIES: Allergies  Allergen Reactions   Gramineae Pollens    Mold Extract [Trichophyton]     HOME MEDICATIONS:  Current Outpatient Medications:    acetaminophen (TYLENOL) 325 MG tablet, Take 650 mg by mouth every 6 (six) hours as needed for mild pain, fever or headache., Disp: , Rfl:    cetirizine (ZYRTEC) 10 MG tablet, Take 10 mg by mouth daily., Disp: , Rfl: 6   Multiple Vitamin (MULTIVITAMIN WITH MINERALS) TABS tablet, Take 1 tablet by mouth daily., Disp: , Rfl:    ondansetron (ZOFRAN ODT) 8 MG disintegrating tablet, Take 1 tablet (8 mg total) by mouth every 8 (eight) hours as needed for nausea or vomiting., Disp: 20 tablet, Rfl: 0   SUPER B COMPLEX/C PO, Take 1 tablet by mouth daily., Disp: , Rfl:    Vitamin D, Ergocalciferol, (DRISDOL) 1.25 MG (50000 UNIT) CAPS capsule, TAKE 1 CAPSULE (50,000 UNITS TOTAL) BY MOUTH EVERY 7 (SEVEN) DAYS, Disp: 13 capsule, Rfl: 1  PAST MEDICAL HISTORY: Past Medical History:  Diagnosis Date   Eczema    Hypertension    Multiple sclerosis (HCC)     PAST SURGICAL  HISTORY: Past Surgical History:  Procedure Laterality Date   NO PAST SURGERIES      FAMILY HISTORY: Family History  Problem Relation Age of Onset   Hypertension Mother    Hypertension Father    Lupus Sister    Multiple sclerosis Maternal Uncle    Migraines Neg Hx    Seizures Neg Hx    Autism Neg Hx    ADD / ADHD Neg Hx    Anxiety disorder Neg Hx    Depression Neg Hx    Bipolar disorder Neg Hx    Schizophrenia Neg Hx     SOCIAL HISTORY:  Social History   Socioeconomic History   Marital status: Single    Spouse name: Not on file   Number of children: 0   Years of education: 12+   Highest education level: Not on file  Occupational History   Occupation: [employer] [place], attending college  Tobacco Use   Smoking status: Never   Smokeless tobacco: Never  Vaping Use  Vaping Use: Never used  Substance and Sexual Activity   Alcohol use: Never   Drug use: Never   Sexual activity: Not on file  Other Topics Concern   Not on file  Social History Narrative   Left handed   Caffeine use: 3-4Network engineere per week   Lives with mom dad and sister visits she is in college. He takes online college courses   Social Determinants of Corporate investment banker Strain: Not on file  Food Insecurity: Not on file  Transportation Needs: Not on file  Physical Activity: Not on file  Stress: Not on file  Social Connections: Not on file  Intimate Partner Violence: Not on file     PHYSICAL EXAM  Vitals:   03/22/22 1533  BP: (!) 148/86  Pulse: 76  Weight: 189 lb 8 oz (86 kg)  Height:  (1.676 m)    Body mass index is 30.59 kg/m.   No results found.    General: The patient is well-developed and well-nourished and in no acute distress  HEENT:  Head is [place]/AT.  Sclera are anicteric.   Skin: Extremities are without rash or  edema.  Mental status: The patient is alert and oriented x 3 at the time of the examination. The patient has apparent normal recent and remote memory,  with an apparently normal attention span and concentration ability.   Speech is normal.  Cranial nerves: Extraocular movements are full. Colors are symmetric.   Facial strength and sensation is normal.   No obvious hearing deficits are noted.  Motor:  Muscle bulk is normal.   Tone is normal. Strength is  5 / 5 in all 4 extremities.   Sensory: Sensory testing is intact to pinprick, soft touch and vibration sensation in all 4 extremities.  Coordination: Cerebellar testing reveals good finger-nose-finger and heel-to-shin bilaterally.  Gait and station: Station is normal.   Gait and tandem gait are normal.. Romberg is negative.   Reflexes: Deep tendon reflexes are symmetric and normal bilaterally.        DIAGNOSTIC DATA (LABS, IMAGING, TESTING) - I reviewed patient records, labs, notes, testing and imaging myself where available.  Lab Results  Component Value Date   WBC 5.0 09/13/2021   HGB 15.8 09/13/2021   HCT 47.3 09/13/2021   MCV 91 09/13/2021   PLT 309 09/13/2021      Component Value Date/Time   NA 140 04/21/2021 0904   K 4.3 04/21/2021 0904   CL 106 04/21/2021 0904   CO2 26 04/21/2021 0904   GLUCOSE 83 04/21/2021 0904   BUN 11 04/21/2021 0904   CREATININE 1.04 04/21/2021 0904   CREATININE 0.99 11/21/2018 1406   CALCIUM 9.7 04/21/2021 0904   PROT 7.2 04/21/2021 0904   ALBUMIN 4.6 04/21/2021 0904   AST 18 04/21/2021 0904   ALT 18 04/21/2021 0904   ALKPHOS 56 04/21/2021 0904   BILITOT 0.5 04/21/2021 0904   GFRNONAA >60 11/26/2020 0541   Lab Results  Component Value Date   CHOL 184 04/21/2021   HDL 47.10 04/21/2021   LDLCALC 125 (H) 04/21/2021   TRIG 57.0 04/21/2021   CHOLHDL 4 04/21/2021   Lab Results  Component Value Date   HGBA1C 5.5 11/21/2018   Lab Results  Component Value Date   VITAMINB12 747 11/23/2020   Lab Results  Component Value Date   TSH 1.04 04/21/2021       ASSESSMENT AND PLAN  Multiple sclerosis (HCC)  High risk medication  use  Mild depression  Vitamin D deficiency  Ataxic gait  1.   Continue Ocrevus.  We will check lab work.  Around the time of the next visit we will recheck an MRI of the brain to determine if there is any breakthrough activity and also recheck IgM/IgG and CBC at that time. 2.   Continue high dose Vit D supplements    3.   Stay active and exercise as tolerated.    4.   If depression worsens, consider an antidepressant medication or referral to behavioral health.   5.   RTC 6 months or sooner if new or worsening symptoms.       Arlis Everly A. Epimenio Foot, MD, [facility] 03/22/2022, 4:04 PM Certified in Neurology, Clinical Neurophysiology, Sleep Medicine and Neuroimaging  [facility] [address] [place], [place] [address] [phone]

## 2022-03-23 LAB — CBC WITH DIFFERENTIAL/PLATELET
Basophils Absolute: 0 10*3/uL (ref 0.0–0.2)
Basos: 0 %
EOS (ABSOLUTE): 0.1 10*3/uL (ref 0.0–0.4)
Eos: 2 %
Hematocrit: 45.7 % (ref 37.5–51.0)
Hemoglobin: 15.8 g/dL (ref 13.0–17.7)
Immature Grans (Abs): 0 10*3/uL (ref 0.0–0.1)
Immature Granulocytes: 0 %
Lymphocytes Absolute: 1.4 10*3/uL (ref 0.7–3.1)
Lymphs: 31 %
MCH: 31.3 pg (ref 26.6–33.0)
MCHC: 34.6 g/dL (ref 31.5–35.7)
MCV: 91 fL (ref 79–97)
Monocytes Absolute: 0.5 10*3/uL (ref 0.1–0.9)
Monocytes: 12 %
Neutrophils Absolute: 2.6 10*3/uL (ref 1.4–7.0)
Neutrophils: 55 %
Platelets: 253 10*3/uL (ref 150–450)
RBC: 5.04 x10E6/uL (ref 4.14–5.80)
RDW: 12.3 % (ref 11.6–15.4)
WBC: 4.6 10*3/uL (ref 3.4–10.8)

## 2022-03-23 LAB — IGG, IGA, IGM
IgA/Immunoglobulin A, Serum: 195 mg/dL (ref 90–386)
IgG (Immunoglobin G), Serum: 1061 mg/dL (ref 603–1613)
IgM (Immunoglobulin M), Srm: 13 mg/dL — ABNORMAL LOW (ref 20–172)

## 2022-03-27 ENCOUNTER — Telehealth: Payer: Self-pay | Admitting: *Deleted

## 2022-03-27 NOTE — Telephone Encounter (Signed)
Submitted PA modafinil on CMM. Key: [id number]. Waiting on determination from [employer].

## 2022-03-27 NOTE — Telephone Encounter (Signed)
PA approved 03/27/2022 - 03/28/2023. PA# [facility] Plan [phone] Non-Grandfathered [id number].

## 2022-04-30 ENCOUNTER — Other Ambulatory Visit: Payer: Self-pay | Admitting: Neurology

## 2022-07-09 ENCOUNTER — Encounter: Payer: Self-pay | Admitting: *Deleted

## 2022-07-19 ENCOUNTER — Encounter: Payer: Self-pay | Admitting: Family Medicine

## 2022-07-19 ENCOUNTER — Ambulatory Visit (INDEPENDENT_AMBULATORY_CARE_PROVIDER_SITE_OTHER): Payer: BC Managed Care – PPO | Admitting: Family Medicine

## 2022-07-19 VITALS — BP 133/79 | HR 57 | Temp 97.7°F | Ht 65.0 in | Wt 193.0 lb

## 2022-07-19 DIAGNOSIS — L309 Dermatitis, unspecified: Secondary | ICD-10-CM | POA: Insufficient documentation

## 2022-07-19 DIAGNOSIS — E785 Hyperlipidemia, unspecified: Secondary | ICD-10-CM | POA: Diagnosis not present

## 2022-07-19 DIAGNOSIS — G35 Multiple sclerosis: Secondary | ICD-10-CM

## 2022-07-19 DIAGNOSIS — R739 Hyperglycemia, unspecified: Secondary | ICD-10-CM | POA: Diagnosis not present

## 2022-07-19 DIAGNOSIS — Z0001 Encounter for general adult medical examination with abnormal findings: Secondary | ICD-10-CM

## 2022-07-19 DIAGNOSIS — E559 Vitamin D deficiency, unspecified: Secondary | ICD-10-CM | POA: Diagnosis not present

## 2022-07-19 DIAGNOSIS — F32A Depression, unspecified: Secondary | ICD-10-CM

## 2022-07-19 DIAGNOSIS — Z23 Encounter for immunization: Secondary | ICD-10-CM

## 2022-07-19 DIAGNOSIS — J309 Allergic rhinitis, unspecified: Secondary | ICD-10-CM

## 2022-07-19 DIAGNOSIS — Z1159 Encounter for screening for other viral diseases: Secondary | ICD-10-CM

## 2022-07-19 LAB — LIPID PANEL
Cholesterol: 196 mg/dL (ref 0–200)
HDL: 50 mg/dL (ref >39.00)
LDL Cholesterol: 135 mg/dL — ABNORMAL HIGH (ref 0–99)
NonHDL: 146.21
Total CHOL/HDL Ratio: 4
Triglycerides: 56 mg/dL (ref 0.0–149.0)
VLDL: 11.2 mg/dL (ref 0.0–40.0)

## 2022-07-19 LAB — COMPREHENSIVE METABOLIC PANEL
ALT: 22 U/L (ref 0–53)
AST: 26 U/L (ref 0–37)
Albumin: 4.7 g/dL (ref 3.5–5.2)
Alkaline Phosphatase: 68 U/L (ref 39–117)
BUN: 13 mg/dL (ref 6–23)
CO2: 28 mEq/L (ref 19–32)
Calcium: 9.8 mg/dL (ref 8.4–10.5)
Chloride: 104 mEq/L (ref 96–112)
Creatinine, Ser: 1.19 mg/dL (ref 0.40–1.50)
GFR: 87.45 mL/min (ref >60.00)
Glucose, Bld: 87 mg/dL (ref 70–99)
Potassium: 4.8 mEq/L (ref 3.5–5.1)
Sodium: 138 mEq/L (ref 135–145)
Total Bilirubin: 0.4 mg/dL (ref 0.2–1.2)
Total Protein: 7.7 g/dL (ref 6.0–8.3)

## 2022-07-19 LAB — VITAMIN D 25 HYDROXY (VIT D DEFICIENCY, FRACTURES): VITD: 37.31 ng/mL (ref 30.00–100.00)

## 2022-07-19 LAB — TSH: TSH: 1.24 u[IU]/mL (ref 0.35–5.50)

## 2022-07-19 LAB — CBC
HCT: 47.4 % (ref 39.0–52.0)
Hemoglobin: 15.7 g/dL (ref 13.0–17.0)
MCHC: 33 g/dL (ref 30.0–36.0)
MCV: 93.9 fl (ref 78.0–100.0)
Platelets: 254 10*3/uL (ref 150.0–400.0)
RBC: 5.05 Mil/uL (ref 4.22–5.81)
RDW: 12.7 % (ref 11.5–15.5)
WBC: 3.1 10*3/uL — ABNORMAL LOW (ref 4.0–10.5)

## 2022-07-19 LAB — HEMOGLOBIN A1C: Hgb A1c MFr Bld: 5.6 % (ref 4.6–6.5)

## 2022-07-19 MED ORDER — TRIAMCINOLONE ACETONIDE 0.5 % EX OINT: 1.0000 | TOPICAL_OINTMENT | Freq: Two times a day (BID) | CUTANEOUS | 3 refills | Status: DC

## 2022-07-19 NOTE — Assessment & Plan Note (Signed)
On Ocrevus per neurology.  Symptoms have been stable.  No recent flares.

## 2022-07-19 NOTE — Assessment & Plan Note (Signed)
Check vitamin D. 

## 2022-07-19 NOTE — Assessment & Plan Note (Signed)
Stable on Zyrtec 10 mg daily. 

## 2022-07-19 NOTE — Patient Instructions (Signed)
It was very nice to see you today!  We will check blood work today.  We will give your tetanus vaccine today.  Please use the triamcinolone as needed for eczema flares.  Please continue work on diet and exercise and we will see you back in year.  Come back to see me sooner if needed.  Take care, Dr Jerline Pain  PLEASE NOTE:  If you had any lab tests please let us know if you have not heard back within a few days. You may see your results on mychart before we have a chance to review them but we will give you a call once they are reviewed by [place]. If we ordered any referrals today, please let us know if you have not heard from their office within the next week.   Please try these tips to maintain a healthy lifestyle:  Eat at least 3 REAL meals and 1-2 snacks per day.  Aim for no more than 5 hours between eating.  If you eat breakfast, please do so within one hour of getting up.   Each meal should contain half fruits/vegetables, one quarter protein, and one quarter carbs (no bigger than a computer mouse)  Cut down on sweet beverages. This includes juice, soda, and sweet tea.   Drink at least 1 glass of water with each meal and aim for at least 8 glasses per day  Exercise at least 150 minutes every week.    Preventive Care 21-21 Years Old, Male Preventive care refers to lifestyle choices and visits with your health care provider that can promote health and wellness. Preventive care visits are also called wellness exams. What can I expect for my preventive care visit? Counseling During your preventive care visit, your health care provider may ask about your: Medical history, including: Past medical problems. Family medical history. Current health, including: Emotional well-being. Home life and relationship well-being. Sexual activity. Lifestyle, including: Alcohol, nicotine or tobacco, and drug use. Access to firearms. Diet, exercise, and sleep habits. Safety issues such as seatbelt  and bike helmet use. Sunscreen use. Work and work Statistician. Physical exam Your health care provider may check your: Height and weight. These may be used to calculate your BMI (body mass index). BMI is a measurement that tells if you are at a healthy weight. Waist circumference. This measures the distance around your waistline. This measurement also tells if you are at a healthy weight and may help predict your risk of certain diseases, such as type 2 diabetes and high blood pressure. Heart rate and blood pressure. Body temperature. Skin for abnormal spots. What immunizations do I need?  Vaccines are usually given at various ages, according to a schedule. Your health care provider will recommend vaccines for you based on your age, medical history, and lifestyle or other factors, such as travel or where you work. What tests do I need? Screening Your health care provider may recommend screening tests for certain conditions. This may include: Lipid and cholesterol levels. Diabetes screening. This is done by checking your blood sugar (glucose) after you have not eaten for a while (fasting). Hepatitis B test. Hepatitis C test. HIV (human immunodeficiency virus) test. STI (sexually transmitted infection) testing, if you are at risk. Talk with your health care provider about your test results, treatment options, and if necessary, the need for more tests. Follow these instructions at home: Eating and drinking  Eat a healthy diet that includes fresh fruits and vegetables, whole grains, lean protein, and low-fat dairy  products. Drink enough fluid to keep your urine pale yellow. Take vitamin and mineral supplements as recommended by your health care provider. Do not drink alcohol if your health care provider tells you not to drink. If you drink alcohol: Limit how much you have to 0-2 drinks a day. Know how much alcohol is in your drink. In the [place] one drink equals one 12 oz bottle of beer  (355 mL), one 5 oz glass of wine (148 mL), or one 1 oz glass of hard liquor (44 mL). Lifestyle Brush your teeth every morning and night with fluoride toothpaste. Floss one time each day. Exercise for at least 30 minutes 5 or more days each week. Do not use any products that contain nicotine or tobacco. These products include cigarettes, chewing tobacco, and vaping devices, such as e-cigarettes. If you need help quitting, ask your health care provider. Do not use drugs. If you are sexually active, practice safe sex. Use a condom or other form of protection to prevent STIs. Find healthy ways to manage stress, such as: Meditation, yoga, or listening to music. Journaling. Talking to a trusted person. Spending time with friends and family. Minimize exposure to UV radiation to reduce your risk of skin cancer. Safety Always wear your seat belt while driving or riding in a vehicle. Do not drive: If you have been drinking alcohol. Do not ride with someone who has been drinking. If you have been using any mind-altering substances or drugs. While texting. When you are tired or distracted. Wear a helmet and other protective equipment during sports activities. If you have firearms in your house, make sure you follow all gun safety procedures. Seek help if you have been physically or sexually abused. What's next? Go to your health care provider once a year for an annual wellness visit. Ask your health care provider how often you should have your eyes and teeth checked. Stay up to date on all vaccines. This information is not intended to replace advice given to you by your health care provider. Make sure you discuss any questions you have with your health care provider. Document Revised: 03/29/2021 Document Reviewed: 03/29/2021 Elsevier Patient Education  2023 [employer].

## 2022-07-19 NOTE — Progress Notes (Signed)
Chief Complaint:  Sean Fox. is a 21 y.o. male who presents today for his annual comprehensive physical exam.    Assessment/Plan:  Chronic Problems Addressed Today: Eczema Not controlled.  Has several outbreaks on elbows and neck.  We will start triamcinolone.   Dyslipidemia Discussed lifestyle modifications.  Check lipids.  Mild depression (HCC) PHQ score 4 today.  Symptoms are manageable.  We discussed medications however he deferred.  We will continue with watchful waiting.  Multiple sclerosis ([place]) On Ocrevus per neurology.  Symptoms have been stable.  No recent flares.  Allergic rhinitis Stable on Zyrtec 10 mg daily.  Vitamin D deficiency Check vitamin D.   Preventative Healthcare: Tdap given today.  Check labs.  Patient Counseling(The following topics were reviewed and/or handout was given):  -Nutrition: Stressed importance of moderation in sodium/caffeine intake, saturated fat and cholesterol, caloric balance, sufficient intake of fresh fruits, vegetables, and fiber.  -Stressed the importance of regular exercise.   -Substance Abuse: Discussed cessation/primary prevention of tobacco, alcohol, or other drug use; driving or other dangerous activities under the influence; availability of treatment for abuse.   -Injury prevention: Discussed safety belts, safety helmets, smoke detector, smoking near bedding or upholstery.   -Sexuality: Discussed sexually transmitted diseases, partner selection, use of condoms, avoidance of unintended pregnancy and contraceptive alternatives.   -Dental health: Discussed importance of regular tooth brushing, flossing, and dental visits.  -Health maintenance and immunizations reviewed. Please refer to Health maintenance section.  Return to care in 1 year for next preventative visit.     Subjective:  HPI:  He has no acute complaints today.   Lifestyle Diet: Balanced.  Exercise: About 10 minutes daily.      06/13/2021   10:32 AM   Depression screen PHQ 2/9  Decreased Interest 0  Down, Depressed, Hopeless 0  PHQ - 2 Score 0    Health Maintenance Due  Topic Date Due   HPV VACCINES (1 - Male 2-dose series) Never done   Hepatitis C Screening  Never done   TETANUS/TDAP  Never done     ROS: Per HPI, otherwise a complete review of systems was negative.   PMH:  The following were reviewed and entered/updated in epic: Past Medical History:  Diagnosis Date   Eczema    Hypertension    Multiple sclerosis ([place])    Patient Active Problem List   Diagnosis Date Noted   Eczema 07/19/2022   Dyslipidemia 07/19/2022   High risk medication use 12/12/2020   Mild depression 12/12/2020   Multiple sclerosis ([place]) 11/23/2020   Allergic rhinitis 04/12/2020   Vitamin D deficiency 01/02/2019   Past Surgical History:  Procedure Laterality Date   NO PAST SURGERIES      Family History  Problem Relation Age of Onset   Hypertension Mother    Hypertension Father    Lupus Sister    Multiple sclerosis Maternal Uncle    Migraines Neg Hx    Seizures Neg Hx    Autism Neg Hx    ADD / ADHD Neg Hx    Anxiety disorder Neg Hx    Depression Neg Hx    Bipolar disorder Neg Hx    Schizophrenia Neg Hx     Medications- reviewed and updated Current Outpatient Medications  Medication Sig Dispense Refill   cetirizine (ZYRTEC) 10 MG tablet Take 10 mg by mouth daily.  6   cholecalciferol (VITAMIN D3) 25 MCG (1000 UNIT) tablet Take 1,000 Units by mouth daily.  thiamine (VITAMIN B-1) 100 MG tablet Take 100 mg by mouth daily.     triamcinolone ointment (KENALOG) 0.5 % Apply 1 Application topically 2 (two) times daily. 60 g 3   No current facility-administered medications for this visit.    Allergies-reviewed and updated Allergies  Allergen Reactions   Gramineae Pollens    Mold Extract [Trichophyton]     Social History   Socioeconomic History   Marital status: Single    Spouse name: Not on file   Number of children: 0    Years of education: 12+   Highest education level: Not on file  Occupational History   Occupation: [employer] [place], attending college  Tobacco Use   Smoking status: Never   Smokeless tobacco: Never  Vaping Use   Vaping Use: Never used  Substance and Sexual Activity   Alcohol use: Never   Drug use: Never   Sexual activity: Not on file  Other Topics Concern   Not on file  Social History Narrative   Left handed   Caffeine use: 3-4 cups coffee per week   Lives with mom dad and sister visits she is in college. He takes Network engineer courses   Social Determinants of Corporate investment banker Strain: Not on file  Food Insecurity: Not on file  Transportation Needs: Not on file  Physical Activity: Not on file  Stress: Not on file  Social Connections: Not on file        Objective:  Physical Exam: BP 133/79   Pulse (!) 57   Temp 97.7 F (36.5 C) (Temporal)   Ht 5\' 5"  (1.651 m)   Wt 193 lb (87.5 kg)   SpO2 98%   BMI 32.12 kg/m   Body mass index is 32.12 kg/m. Wt Readings from Last 3 Encounters:  07/19/22 193 lb (87.5 kg)  03/22/22 189 lb 8 oz (86 kg)  09/13/21 187 lb (84.8 kg)   Gen: NAD, resting comfortably HEENT: TMs normal bilaterally. OP clear. No thyromegaly noted.  CV: RRR with no murmurs appreciated Pulm: NWOB, CTAB with no crackles, wheezes, or rhonchi GI: Normal bowel sounds present. Soft, Nontender, Nondistended. MSK: no edema, cyanosis, or clubbing noted Skin: warm, dry Neuro: CN2-12 grossly intact. Strength 5/5 in upper and lower extremities. Reflexes symmetric and intact bilaterally.  Psych: Normal affect and thought content     Sean Fox M. 09/15/21, MD 07/19/2022 9:17 AM

## 2022-07-19 NOTE — Assessment & Plan Note (Signed)
Not controlled.  Has several outbreaks on elbows and neck.  We will start triamcinolone.

## 2022-07-19 NOTE — Assessment & Plan Note (Signed)
PHQ score 4 today.  Symptoms are manageable.  We discussed medications however he deferred.  We will continue with watchful waiting.

## 2022-07-19 NOTE — Addendum Note (Signed)
Addended by: Betti Cruz on: 07/19/2022 03:07 PM   Modules accepted: Orders

## 2022-07-19 NOTE — Assessment & Plan Note (Signed)
Discussed lifestyle modifications. Check lipids.  ?

## 2022-07-20 LAB — HEPATITIS C ANTIBODY: Hepatitis C Ab: NONREACTIVE

## 2022-07-24 NOTE — Progress Notes (Signed)
Please inform patient of the following:  Cholesterol borderline elevated but stable. Everything else is normal. Do not need to make any changes to his treatment plan at this time. He should continue to work on diet and exercise and we can recheck in a year.  Sean Fox. Jerline Pain, MD 07/24/2022 8:20 AM

## 2022-09-20 NOTE — Patient Instructions (Signed)
Below is our plan:  We will continue current plan. I would encourage you to increase activity.   Please make sure you are staying well hydrated. I recommend 50-60 ounces daily. Well balanced diet and regular exercise encouraged. Consistent sleep schedule with 6-8 hours recommended.   Please continue follow up with care team as directed.   Follow up with Dr Epimenio Foot in 6 months   You may receive a survey regarding today's visit. I encourage you to leave honest feed back as I do use this information to improve patient care. Thank you for seeing me today!

## 2022-09-20 NOTE — Progress Notes (Signed)
Chief Complaint  Patient presents with   Follow-up    Pt in room #1 with his father.  pt here today for f/u on Ocrevus for his MS.    HISTORY OF PRESENT ILLNESS:  09/24/22 ALL:  Sean Fox. is a 21 y.o. male here today for follow up for RRMS. He continues Ovrevus infusions and tolerating well. Labs have been stable. Last MR brain stable 07/2021.   He reports doing well. No new or exacerbating symptoms. Gait is stable. No difficulty with stairs. He does not use assistive device. He denies diplopia.   Mood is good. He is sleeping well. He does continue to note cognitive deficits of inattention and word finding difficulty but not overly bothered by this. He has not been as active, recently.   Vitamin D was 37 07/2022. He continues vitamin D supplements 1000iu daily.   HISTORY (copied from Dr Bonnita Hollow previous note)  Sean Fox is a 21 y.o. man with relapsing remitting multiple sclerosis.   UPDATE 03/22/2022: He is on Ocrevus (first infusions 01/18/2021 and 02/02/2021) and had his next  infusion 08/2022   He tolerated them very well.[name] He denies any exacerbations or new symptoms.       He denies any difficulty with his gait, strength or sensation.   Balance is back to baseline and he does not need the bannister on stairs.   He keeps up with his peers.  However, he gets fatigued more quickly.     He denies diplopia or other visual issue.  He has nocturia 3-4 times a night.   He denies urgency during the day.     He still notes fatigue, most days.   He sleeps well most nights with 7-8 hours   even with the nocturia.      He has noted  mild cognitive issues and notes  decreased focus/attention and word finding issues at school.     He has noted mild depression/anxiety since his diagnosis.    He feels it is not severe enough to start a medication.      MS HISTORY: He presented to [facility] 11/23/2020 with double vision, nystagmus, dizziness, altered taste and left facial paresthesias.   Symptoms started with a headache 11/19/20 and he woke up with dizziness the next day 11/20/2020.  The next day he had diplopia and nystagmus and saw urgent care.   The next day 11/22/2020 he also had nausea, vomiting.   His PCP ordered an MRI that was performed 11/23/2020.  It showed multiple lesions c/w MS in supratentoiral and infratentorial white matter. and he was sent to the ED.   Further MRI showed an enhancing lesion in the left pons/middle cerebellar peduncle.   MRI cervical spine showed 2 more lesions.  He was admitted and received 5 days of IV Slu-medrol l with improvement of the symptoms      He [facility] 2022   In retrospect, 3 years ago, he had numbness in the left leg with some ataxia lasting 2-3 weeks.   He notes mild visual blurring at times and was told he has mild astigmatism.        FH:   Multiple sclerosis in a maternal uncle (had severe neurologic issues starting at age 17's but not seen; became bed-bound and died), cousin (diagnosed around age 24 and doing well) and probably grandfather (progressive neurologic issues affecting gait).     Imaging studies MRI brain 09/13/2021 showed  Multiple T2/FLAIR hyperintense  foci in the hemispheres, spinal cord, middle cerebellar peduncles, thalamus and cerebellum.  None of the foci enhance or appear to be acute.  There were no new lesions compared to the MRI from 11/23/2020.[name]   MRI of the brain 11/23/2020 showed multiple T2/FLAIR hyperintense foci in the hemispheres predominantly in the periventricular white matter but also in the juxtacortical and deep white matter.  Foci were also present in the left middle cerebral peduncle, right middle cerebellar peduncle, left middle cerebellar peduncle and right cerebellar hemisphere..   The large focus in the left middle cerebellar peduncle enhanced after contrast.  None of the other foci enhanced.   MRI of the cervical spine 11/24/2020 showed posterior lesions at C2-C3 and C3-C4 consistent with  demyelination.  They did not enhance.  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  There are mild degenerative changes at C3-C4, C5-C6 and C7-T1.   Laboratory tests: NMO Ab, HIV, B12, anti-Jo1, ENA, ACE, CBC/diff and CMP were negative or unremarkable. Vitamin D was reduced at 22.49  REVIEW OF SYSTEMS: Out of a complete 14 system review of symptoms, the patient complains only of the following symptoms, word finding difficulty, inattention nd all other reviewed systems are negative.   ALLERGIES: Allergies  Allergen Reactions   Gramineae Pollens    Mold Extract [Trichophyton]      HOME MEDICATIONS: Outpatient Medications Prior to Visit  Medication Sig Dispense Refill   cetirizine (ZYRTEC) 10 MG tablet Take 10 mg by mouth daily.  6   cholecalciferol (VITAMIN D3) 25 MCG (1000 UNIT) tablet Take 1,000 Units by mouth daily.     triamcinolone ointment (KENALOG) 0.5 % Apply 1 Application topically 2 (two) times daily. 60 g 3   thiamine (VITAMIN B-1) 100 MG tablet Take 100 mg by mouth daily.     No facility-administered medications prior to visit.     PAST MEDICAL HISTORY: Past Medical History:  Diagnosis Date   Eczema    Hypertension    Multiple sclerosis (HCC)      PAST SURGICAL HISTORY: Past Surgical History:  Procedure Laterality Date   NO PAST SURGERIES       FAMILY HISTORY: Family History  Problem Relation Age of Onset   Hypertension Mother    Hypertension Father    Lupus Sister    Multiple sclerosis Maternal Uncle    Migraines Neg Hx    Seizures Neg Hx    Autism Neg Hx    ADD / ADHD Neg Hx    Anxiety disorder Neg Hx    Depression Neg Hx    Bipolar disorder Neg Hx    Schizophrenia Neg Hx      SOCIAL HISTORY: Social History   Socioeconomic History   Marital status: Single    Spouse name: Not on file   Number of children: 0   Years of education: 12+   Highest education level: Not on file  Occupational History   Occupation: [employer] [place], attending college   Tobacco Use   Smoking status: Never   Smokeless tobacco: Never  Vaping Use   Vaping Use: Never used  Substance and Sexual Activity   Alcohol use: Never   Drug use: Never   Sexual activity: Not on file  Other Topics Concern   Not on file  Social History Narrative   Left handed   Caffeine use: 3-4 cups coffee per week   Lives with mom dad and sister visits she is in college. He takes online college courses   Social Determinants  of Health   Financial Resource Strain: Not on file  Food Insecurity: Not on file  Transportation Needs: Not on file  Physical Activity: Not on file  Stress: Not on file  Social Connections: Not on file  Intimate Partner Violence: Not on file     PHYSICAL EXAM  Vitals:   09/24/22 1243  BP: 138/84  Pulse: 68  Weight: 195 lb 8 oz (88.7 kg)  Height: 5\' 6"  (1.676 m)   Body mass index is 31.55 kg/m.  Generalized: Well developed, in no acute distress  Cardiology: normal rate and rhythm, no murmur auscultated  Respiratory: clear to auscultation bilaterally    Neurological examination  Mentation: Alert oriented to time, place, history taking. Follows all commands speech and language fluent Cranial nerve II-XII: Pupils were equal round reactive to light. Extraocular movements were full, visual field were full on confrontational test. Facial sensation and strength were normal. Head turning and shoulder shrug  were normal and symmetric. Motor: The motor testing reveals 5 over 5 strength of all 4 extremities. Good symmetric motor tone is noted throughout.  Sensory: Sensory testing is intact to soft touch on all 4 extremities. No evidence of extinction is noted.  Coordination: Cerebellar testing reveals good finger-nose-finger and heel-to-shin bilaterally.  Gait and station: Gait is normal.  Reflexes: Deep tendon reflexes are symmetric and normal bilaterally.    DIAGNOSTIC DATA (LABS, IMAGING, TESTING) - I reviewed patient records, labs, notes,  testing and imaging myself where available.  Lab Results  Component Value Date   WBC 3.1 (L) 07/19/2022   HGB 15.7 07/19/2022   HCT 47.4 07/19/2022   MCV 93.9 07/19/2022   PLT 254.0 07/19/2022      Component Value Date/Time   NA 138 07/19/2022 0926   K 4.8 07/19/2022 0926   CL 104 07/19/2022 0926   CO2 28 07/19/2022 0926   GLUCOSE 87 07/19/2022 0926   BUN 13 07/19/2022 0926   CREATININE 1.19 07/19/2022 0926   CREATININE 0.99 11/21/2018 1406   CALCIUM 9.8 07/19/2022 0926   PROT 7.7 07/19/2022 0926   ALBUMIN 4.7 07/19/2022 0926   AST 26 07/19/2022 0926   ALT 22 07/19/2022 0926   ALKPHOS 68 07/19/2022 0926   BILITOT 0.4 07/19/2022 0926   GFRNONAA >60 11/26/2020 0541   Lab Results  Component Value Date   CHOL 196 07/19/2022   HDL 50.00 07/19/2022   LDLCALC 135 (H) 07/19/2022   TRIG 56.0 07/19/2022   CHOLHDL 4 07/19/2022   Lab Results  Component Value Date   HGBA1C 5.6 07/19/2022   Lab Results  Component Value Date   VITAMINB12 747 11/23/2020   Lab Results  Component Value Date   TSH 1.24 07/19/2022        No data to display               No data to display           ASSESSMENT AND PLAN  21 y.o. year old male  has a past medical history of Eczema, Hypertension, and Multiple sclerosis (HCC). here with    Relapsing remitting multiple sclerosis (HCC) - Plan: IgG, IgA, IgM, CBC with Differential/Platelets  High risk medication use  Vitamin D deficiency  [facility]. Is doing well. We will continue Ocrevus infusions. I will update labs, today. He prefers to wait until next visit to update MRI. Healthy lifestyle habits encouraged. He will follow up with PCP as directed. He will return to see Dr [facility] in  6 months, sooner if needed. She verbalizes understanding and agreement with this plan.   Orders Placed This Encounter  Procedures   IgG, IgA, IgM   CBC with Differential/Platelets     No orders of the defined types were placed in this  encounter.    Shawnie Dapper, MSN, FNP-C 09/24/2022, 1:15 PM  [facility] [address], [address] [place], [place] [address] [phone]

## 2022-09-24 ENCOUNTER — Ambulatory Visit: Payer: BC Managed Care – PPO | Admitting: Family Medicine

## 2022-09-24 ENCOUNTER — Encounter: Payer: Self-pay | Admitting: Family Medicine

## 2022-09-24 VITALS — BP 138/84 | HR 68 | Ht 66.0 in | Wt 195.5 lb

## 2022-09-24 DIAGNOSIS — Z79899 Other long term (current) drug therapy: Secondary | ICD-10-CM

## 2022-09-24 DIAGNOSIS — G35 Multiple sclerosis: Secondary | ICD-10-CM | POA: Diagnosis not present

## 2022-09-24 DIAGNOSIS — E559 Vitamin D deficiency, unspecified: Secondary | ICD-10-CM | POA: Diagnosis not present

## 2022-09-25 LAB — CBC WITH DIFFERENTIAL/PLATELET
Basophils Absolute: 0 10*3/uL (ref 0.0–0.2)
Basos: 1 %
EOS (ABSOLUTE): 0 10*3/uL (ref 0.0–0.4)
Eos: 1 %
Hematocrit: 47.4 % (ref 37.5–51.0)
Hemoglobin: 16.2 g/dL (ref 13.0–17.7)
Immature Grans (Abs): 0 10*3/uL (ref 0.0–0.1)
Immature Granulocytes: 0 %
Lymphocytes Absolute: 1.3 10*3/uL (ref 0.7–3.1)
Lymphs: 32 %
MCH: 30.8 pg (ref 26.6–33.0)
MCHC: 34.2 g/dL (ref 31.5–35.7)
MCV: 90 fL (ref 79–97)
Monocytes Absolute: 0.5 10*3/uL (ref 0.1–0.9)
Monocytes: 13 %
Neutrophils Absolute: 2.2 10*3/uL (ref 1.4–7.0)
Neutrophils: 53 %
Platelets: 281 10*3/uL (ref 150–450)
RBC: 5.26 x10E6/uL (ref 4.14–5.80)
RDW: 12.5 % (ref 11.6–15.4)
WBC: 4.2 10*3/uL (ref 3.4–10.8)

## 2022-09-25 LAB — IGG, IGA, IGM
IgA/Immunoglobulin A, Serum: 192 mg/dL (ref 90–386)
IgG (Immunoglobin G), Serum: 1103 mg/dL (ref 603–1613)
IgM (Immunoglobulin M), Srm: 15 mg/dL — ABNORMAL LOW (ref 20–172)

## 2022-10-02 DIAGNOSIS — Z0271 Encounter for disability determination: Secondary | ICD-10-CM

## 2022-12-14 ENCOUNTER — Telehealth: Payer: BC Managed Care – PPO | Admitting: Physician Assistant

## 2022-12-14 DIAGNOSIS — U071 COVID-19: Secondary | ICD-10-CM

## 2022-12-14 MED ORDER — MOLNUPIRAVIR EUA 200MG CAPSULE: 4.0000 | ORAL_CAPSULE | Freq: Two times a day (BID) | ORAL | 0 refills | Status: AC

## 2022-12-14 NOTE — Patient Instructions (Signed)
Tonia Ghent., thank you for joining Mar Daring, PA-C for today's virtual visit.  While this provider is not your primary care provider (PCP), if your PCP is located in our provider database this encounter information will be shared with them immediately following your visit.   [facility] account gives you access to today's visit and all your visits, tests, and labs performed at [facility] " click here if you don't have a [facility] account or go to [email or web address]  Consent: (Patient) Sean Fox. provided verbal consent for this virtual visit at the beginning of the encounter.  Current Medications:  Current Outpatient Medications:    cetirizine (ZYRTEC) 10 MG tablet, Take 10 mg by mouth daily., Disp: , Rfl: 6   cholecalciferol (VITAMIN D3) 25 MCG (1000 UNIT) tablet, Take 1,000 Units by mouth daily., Disp: , Rfl:    molnupiravir EUA (LAGEVRIO) 200 mg CAPS capsule, Take 4 capsules (800 mg total) by mouth 2 (two) times daily for 5 days., Disp: 40 capsule, Rfl: 0   triamcinolone ointment (KENALOG) 0.5 %, Apply 1 Application topically 2 (two) times daily., Disp: 60 g, Rfl: 3   Medications ordered in this encounter:  Meds ordered this encounter  Medications   molnupiravir EUA (LAGEVRIO) 200 mg CAPS capsule    Sig: Take 4 capsules (800 mg total) by mouth 2 (two) times daily for 5 days.    Dispense:  40 capsule    Refill:  0    Order Specific Question:   Supervising Provider    Answer:   Chase Picket [id number]     *If you need refills on other medications prior to your next appointment, please contact your pharmacy*  Follow-Up: Call back or seek an in-person evaluation if the symptoms worsen or if the condition fails to improve as anticipated.  [place] [phone]  Care Instructions:  Molnupiravir Capsules What is this medication? MOLNUPIRAVIR (MOL nue PIR a vir) treats mild to moderate COVID-19. It may  help people who are at high risk of developing severe illness. This medication works by limiting the spread of the virus in your body. The FDA has allowed the emergency use of this medication. This medicine may be used for other purposes; ask your health care provider or pharmacist if you have questions. COMMON BRAND NAME(S): LAGEVRIO What should I tell my care team before I take this medication? They need to know if you have any of these conditions: Any allergies Any serious illness An unusual or allergic reaction to molnupiravir, other medications, foods, dyes, or preservatives Pregnant or trying to get pregnant Breast-feeding How should I use this medication? Take this medication by mouth with water. Take it as directed on the prescription label at the same time every day. Do not cut, crush, or chew this medication. Swallow the capsules whole. You can take it with or without food. If it upsets your stomach, take it with food. Take all of it unless your care team tells you to stop it early. Keep taking it even if you think you are better. Talk to your care team about the use of this medication in children. Special care may be needed. Overdosage: If you think you have taken too much of this medicine contact a poison control center or emergency room at once. NOTE: This medicine is only for you. Do not share this medicine with others. What if I miss a dose? If you miss a dose, take it  as soon as you can unless it is more than 10 hours late. If it is more than 10 hours late, skip the missed dose. Take the next dose at the normal time. Do not take extra or 2 doses at the same time to make up for the missed dose. What may interact with this medication? Interactions have not been studied. This list may not describe all possible interactions. Give your health care provider a list of all the medicines, herbs, non-prescription drugs, or dietary supplements you use. Also tell them if you smoke, drink  alcohol, or use illegal drugs. Some items may interact with your medicine. What should I watch for while using this medication? Your condition will be monitored carefully while you are receiving this medication. Visit your care team for regular checkups. Tell your care team if your symptoms do not start to get better or if they get worse. Do not become pregnant while taking this medication. You may need a pregnancy test before starting this medication. Women must use a reliable form of birth control while taking this medication and for 4 days after stopping the medication. Women should inform their care team if they wish to become pregnant or think they might be pregnant. Men should not father a child while taking this medication and for 3 months after stopping it. There is potential for serious harm to an unborn child. Talk to your care team for more information. Do not breast-feed an infant while taking this medication and for 4 days after stopping the medication. What side effects may I notice from receiving this medication? Side effects that you should report to your care team as soon as possible: Allergic reactions--skin rash, itching, hives, swelling of the face, lips, tongue, or throat Side effects that usually do not require medical attention (report these to your care team if they continue or are bothersome): Diarrhea Dizziness Nausea This list may not describe all possible side effects. Call your doctor for medical advice about side effects. You may report side effects to FDA at 1-800-FDA-1088. Where should I keep my medication? Keep out of the reach of children and pets. Store at room temperature between 20 and 25 degrees C (68 and 77 degrees F). Get rid of any unused medication after the expiration date. To get rid of medications that are no longer needed or have expired: Take the medication to a medication take-back program. Check with your pharmacy or law enforcement to find a  location. If you cannot return the medication, check the label or package insert to see if the medication should be thrown out in the garbage or flushed down the toilet. If you are not sure, ask your care team. If it is safe to put it in the trash, take the medication out of the container. Mix the medication with cat litter, dirt, coffee grounds, or other unwanted substance. Seal the mixture in a bag or container. Put it in the trash. NOTE: This sheet is a summary. It may not cover all possible information. If you have questions about this medicine, talk to your doctor, pharmacist, or health care provider.  2023 Elsevier/Gold Standard (2020-10-10 00:00:00)    Isolation Instructions: You are to isolate at home for 5 days from onset of your symptoms. If you must be around other household members who do not have symptoms, you need to make sure that both you and the family members are masking consistently with a high-quality mask.  After day 5 of isolation, if you  have had no fever within 24 hours and you are feeling better, you can end isolation but need to mask for an additional 5 days.  After day 5 if you have a fever or are having significant symptoms, please isolate for full 10 days.  If you note any worsening of symptoms despite treatment, please seek an in-person evaluation ASAP. If you note any significant shortness of breath or any chest pain, please seek ER evaluation. Please do not delay care!   COVID-19: What to Do if You Are Sick If you test positive and are an older adult or someone who is at high risk of getting very sick from COVID-19, treatment may be available. Contact a healthcare provider right away after a positive test to determine if you are eligible, even if your symptoms are mild right now. You can also visit a Test to Treat location and, if eligible, receive a prescription from a provider. Don't delay: Treatment must be started within the first few days to be effective. If  you have a fever, cough, or other symptoms, you might have COVID-19. Most people have mild illness and are able to recover at home. If you are sick: Keep track of your symptoms. If you have an emergency warning sign (including trouble breathing), call 911. Steps to help prevent the spread of COVID-19 if you are sick If you are sick with COVID-19 or think you might have COVID-19, follow the steps below to care for yourself and to help protect other people in your home and community. Stay home except to get medical care Stay home. Most people with COVID-19 have mild illness and can recover at home without medical care. Do not leave your home, except to get medical care. Do not visit public areas and do not go to places where you are unable to wear a mask. Take care of yourself. Get rest and stay hydrated. Take over-the-counter medicines, such as acetaminophen, to help you feel better. Stay in touch with your doctor. Call before you get medical care. Be sure to get care if you have trouble breathing, or have any other emergency warning signs, or if you think it is an emergency. Avoid public transportation, ride-sharing, or taxis if possible. Get tested If you have symptoms of COVID-19, get tested. While waiting for test results, stay away from others, including staying apart from those living in your household. Get tested as soon as possible after your symptoms start. Treatments may be available for people with COVID-19 who are at risk for becoming very sick. Don't delay: Treatment must be started early to be effective--some treatments must begin within 5 days of your first symptoms. Contact your healthcare provider right away if your test result is positive to determine if you are eligible. Self-tests are one of several options for testing for the virus that causes COVID-19 and may be more convenient than laboratory-based tests and point-of-care tests. Ask your healthcare provider or your local health  department if you need help interpreting your test results. You can visit your state, tribal, local, and territorial health department's website to look for the latest local information on testing sites. Separate yourself from other people As much as possible, stay in a specific room and away from other people and pets in your home. If possible, you should use a separate bathroom. If you need to be around other people or animals in or outside of the home, wear a well-fitting mask. Tell your close contacts that they may have been  exposed to COVID-19. An infected person can spread COVID-19 starting 48 hours (or 2 days) before the person has any symptoms or tests positive. By letting your close contacts know they may have been exposed to COVID-19, you are helping to protect everyone. See COVID-19 and Animals if you have questions about pets. If you are diagnosed with COVID-19, someone from the health department may call you. Answer the call to slow the spread. Monitor your symptoms Symptoms of COVID-19 include fever, cough, or other symptoms. Follow care instructions from your healthcare provider and local health department. Your local health authorities may give instructions on checking your symptoms and reporting information. When to seek emergency medical attention Look for emergency warning signs* for COVID-19. If someone is showing any of these signs, seek emergency medical care immediately: Trouble breathing Persistent pain or pressure in the chest New confusion Inability to wake or stay awake Pale, gray, or blue-colored skin, lips, or nail beds, depending on skin tone *This list is not all possible symptoms. Please call your medical provider for any other symptoms that are severe or concerning to you. Call 911 or call ahead to your local emergency facility: Notify the operator that you are seeking care for someone who has or may have COVID-19. Call ahead before visiting your doctor Call  ahead. Many medical visits for routine care are being postponed or done by phone or telemedicine. If you have a medical appointment that cannot be postponed, call your doctor's office, and tell them you have or may have COVID-19. This will help the office protect themselves and other patients. If you are sick, wear a well-fitting mask You should wear a mask if you must be around other people or animals, including pets (even at home). Wear a mask with the best fit, protection, and comfort for you. You don't need to wear the mask if you are alone. If you can't put on a mask (because of trouble breathing, for example), cover your coughs and sneezes in some other way. Try to stay at least 6 feet away from other people. This will help protect the people around you. Masks should not be placed on young children under age 13 years, anyone who has trouble breathing, or anyone who is not able to remove the mask without help. Cover your coughs and sneezes Cover your mouth and nose with a tissue when you cough or sneeze. Throw away used tissues in a lined trash can. Immediately wash your hands with soap and water for at least 20 seconds. If soap and water are not available, clean your hands with an alcohol-based hand sanitizer that contains at least 60% alcohol. Clean your hands often Wash your hands often with soap and water for at least 20 seconds. This is especially important after blowing your nose, coughing, or sneezing; going to the bathroom; and before eating or preparing food. Use hand sanitizer if soap and water are not available. Use an alcohol-based hand sanitizer with at least 60% alcohol, covering all surfaces of your hands and rubbing them together until they feel dry. Soap and water are the best option, especially if hands are visibly dirty. Avoid touching your eyes, nose, and mouth with unwashed hands. Handwashing Tips Avoid sharing personal household items Do not share dishes, drinking glasses,  cups, eating utensils, towels, or bedding with other people in your home. Wash these items thoroughly after using them with soap and water or put in the dishwasher. Clean surfaces in your home regularly Clean and disinfect  high-touch surfaces (for example, doorknobs, tables, handles, light switches, and countertops) in your "sick room" and bathroom. In shared spaces, you should clean and disinfect surfaces and items after each use by the person who is ill. If you are sick and cannot clean, a caregiver or other person should only clean and disinfect the area around you (such as your bedroom and bathroom) on an as needed basis. Your caregiver/other person should wait as long as possible (at least several hours) and wear a mask before entering, cleaning, and disinfecting shared spaces that you use. Clean and disinfect areas that may have blood, stool, or body fluids on them. Use household cleaners and disinfectants. Clean visible dirty surfaces with household cleaners containing soap or detergent. Then, use a household disinfectant. Use a product from [employer] List N: Disinfectants for Coronavirus ([id number]). Be sure to follow the instructions on the label to ensure safe and effective use of the product. Many products recommend keeping the surface wet with a disinfectant for a certain period of time (look at "contact time" on the product label). You may also need to wear personal protective equipment, such as gloves, depending on the directions on the product label. Immediately after disinfecting, wash your hands with soap and water for 20 seconds. For completed guidance on cleaning and disinfecting your home, visit Complete Disinfection Guidance. Take steps to improve ventilation at home Improve ventilation (air flow) at home to help prevent from spreading COVID-19 to other people in your household. Clear out COVID-19 virus particles in the air by opening windows, using air filters, and turning on fans in  your home. Use this interactive tool to learn how to improve air flow in your home. When you can be around others after being sick with COVID-19 Deciding when you can be around others is different for different situations. Find out when you can safely end home isolation. For any additional questions about your care, contact your healthcare provider or state or local health department. 01/03/2021 Content source: [facility] ([facility]), Division of Viral Diseases This information is not intended to replace advice given to you by your health care provider. Make sure you discuss any questions you have with your health care provider. Document Revised: 02/16/2021 Document Reviewed: 02/16/2021 Elsevier Patient Education  2022 [employer].       If you have been instructed to have an in-person evaluation today at a local Urgent Care facility, please use the link below. It will take you to a list of all of our available [facility], including address, phone number and hours of operation. Please do not delay care.  [facility]  If you or a family member do not have a primary care provider, use the link below to schedule a visit and establish care. When you choose a [facility] primary care physician or advanced practice provider, you gain a long-term partner in health. Find a Primary Care Provider  Learn more about 's in-office and virtual care options: [facility]

## 2022-12-14 NOTE — Progress Notes (Signed)
Virtual Visit Consent   Sean Fox., you are scheduled for a virtual visit with a [facility] provider today. Just as with appointments in the office, your consent must be obtained to participate. Your consent will be active for this visit and any virtual visit you may have with one of our providers in the next 365 days. If you have a MyChart account, a copy of this consent can be sent to you electronically.  As this is a virtual visit, video technology does not allow for your provider to perform a traditional examination. This may limit your provider's ability to fully assess your condition. If your provider identifies any concerns that need to be evaluated in person or the need to arrange testing (such as labs, EKG, etc.), we will make arrangements to do so. Although advances in technology are sophisticated, we cannot ensure that it will always work on either your end or our end. If the connection with a video visit is poor, the visit may have to be switched to a telephone visit. With either a video or telephone visit, we are not always able to ensure that we have a secure connection.  By engaging in this virtual visit, you consent to the provision of healthcare and authorize for your insurance to be billed (if applicable) for the services provided during this visit. Depending on your insurance coverage, you may receive a charge related to this service.  I need to obtain your verbal consent now. Are you willing to proceed with your visit today? [address] Sean Fox. has provided verbal consent on 12/14/2022 for a virtual visit (video or telephone). Mar Daring, PA-C  Date: 12/14/2022 8:53 AM  Virtual Visit via Video Note   I, Mar Daring, connected with  Sean Fox.  ([id number], 2001-09-11) on 12/14/22 at  8:45 AM EST by a video-enabled telemedicine application and verified that I am speaking with the correct person using two identifiers.  Location: Patient: Virtual Visit Location  Patient: Home Provider: Virtual Visit Location Provider: Home Office   I discussed the limitations of evaluation and management by telemedicine and the availability of in person appointments. The patient expressed understanding and agreed to proceed.    History of Present Illness: Sean Fox. is a 22 y.o. who identifies as a male who was assigned male at birth, and is being seen today for Covid 62.  HPI: URI  This is a new problem. Episode onset: Tested positive for Covid 19 on at home test yesterday; symptoms started Wednesday. The problem has been gradually worsening. There has been no fever. Associated symptoms include congestion, coughing (dry), headaches, rhinorrhea, sinus pain and a sore throat. Pertinent negatives include no diarrhea, ear pain, nausea, plugged ear sensation or vomiting. Associated symptoms comments: Fatigue, chills. He has tried acetaminophen for the symptoms. The treatment provided no relief.  PMH: MS   Problems:  Patient Active Problem List   Diagnosis Date Noted   Eczema 07/19/2022   Dyslipidemia 07/19/2022   High risk medication use 12/12/2020   Mild depression 12/12/2020   Multiple sclerosis ([place]) 11/23/2020   Allergic rhinitis 04/12/2020   Vitamin D deficiency 01/02/2019    Allergies:  Allergies  Allergen Reactions   Gramineae Pollens    Mold Extract [Trichophyton]    Medications:  Current Outpatient Medications:    molnupiravir EUA (LAGEVRIO) 200 mg CAPS capsule, Take 4 capsules (800 mg total) by mouth 2 (two) times daily for 5 days., Disp: 40 capsule, Rfl: 0  cetirizine (ZYRTEC) 10 MG tablet, Take 10 mg by mouth daily., Disp: , Rfl: 6   cholecalciferol (VITAMIN D3) 25 MCG (1000 UNIT) tablet, Take 1,000 Units by mouth daily., Disp: , Rfl:    triamcinolone ointment (KENALOG) 0.5 %, Apply 1 Application topically 2 (two) times daily., Disp: 60 g, Rfl: 3  Observations/Objective: Patient is well-developed, well-nourished in no acute distress.   Resting comfortably at home.  Head is normocephalic, atraumatic.  No labored breathing.  Speech is clear and coherent with logical content.  Patient is alert and oriented at baseline.    Assessment and Plan: 1. COVID-19 - MyChart COVID-19 home monitoring program; Future - molnupiravir EUA (LAGEVRIO) 200 mg CAPS capsule; Take 4 capsules (800 mg total) by mouth 2 (two) times daily for 5 days.  Dispense: 40 capsule; Refill: 0  - Continue OTC symptomatic management of choice - Will send OTC vitamins and supplement information through AVS - Molnupiravir prescribed, birth defect risk addressed-medication information provided in AVS, he has taken this before 2 years ago and would like to proceed with this choice again - Patient enrolled in MyChart symptom monitoring - Push fluids - Rest as needed - Discussed return precautions and when to seek in-person evaluation, sent via AVS as well   Follow Up Instructions: I discussed the assessment and treatment plan with the patient. The patient was provided an opportunity to ask questions and all were answered. The patient agreed with the plan and demonstrated an understanding of the instructions.  A copy of instructions were sent to the patient via MyChart unless otherwise noted below.    The patient was advised to call back or seek an in-person evaluation if the symptoms worsen or if the condition fails to improve as anticipated.  Time:  I spent 9 minutes with the patient via telehealth technology discussing the above problems/concerns.    Mar Daring, PA-C

## 2022-12-15 ENCOUNTER — Encounter: Payer: Self-pay | Admitting: Physician Assistant

## 2023-03-28 ENCOUNTER — Ambulatory Visit: Payer: BC Managed Care – PPO | Admitting: Neurology

## 2023-03-28 ENCOUNTER — Encounter: Payer: Self-pay | Admitting: Neurology

## 2023-03-28 VITALS — BP 132/85 | HR 78 | Ht 66.0 in | Wt 199.5 lb

## 2023-03-28 DIAGNOSIS — R260 Ataxic gait: Secondary | ICD-10-CM

## 2023-03-28 DIAGNOSIS — Z79899 Other long term (current) drug therapy: Secondary | ICD-10-CM

## 2023-03-28 DIAGNOSIS — G35 Multiple sclerosis: Secondary | ICD-10-CM

## 2023-03-28 DIAGNOSIS — E559 Vitamin D deficiency, unspecified: Secondary | ICD-10-CM | POA: Diagnosis not present

## 2023-03-28 NOTE — Progress Notes (Signed)
[facility]  PATIENT: Sean Fox. DOB: 11/26/2000  REFERRING DOCTOR OR PCP:  Jacquiline Doe, MD / Lanae Boast, NP SOURCE: Patient, notes from recent hospital admission, imaging and lab results, MRI images personally reviewed.  _________________________________   HISTORICAL  CHIEF COMPLAINT:  Chief Complaint  Patient presents with   Room 11    Pt is here with his Mother. Pt states that things have been alright with his MS. Pt states no new Flare Ups. Pt states that he has a lot of Fatigue.     HISTORY OF PRESENT ILLNESS:  Sean Fox is a 22 y.o. man with relapsing remitting multiple sclerosis.  UPDATE 03/22/2022: He is on Ocrevus (last infusion 02/13/2023).    He tolerates infusionsvery well.[name] He denies any exacerbations or new symptoms.      He has no new neurologic symptoms.  He denies any difficulty with his gait, strength or sensation.   Balance is back to baseline but he still feels better using the bannister. [name]   He keeps up with his peers most of the time.  However, he gets fatigued more quickly if more active.     He denies diplopia or other visual issue.    He has nocturia 3-4 times a night.   He denies urgency during the day.  He occasionally has hesitancy,  He still notes fatigue, most days.   He sleeps well most nights with 7-8 hours      He has noted  mild cognitive issues and notes  decreased focus/attention and word finding issues at school.     He had noted mild depression/anxiety at diagnosis but now feels fine.     He takes vit D daily   MS HISTORY: He presented to [facility] 11/23/2020 with double vision, nystagmus, dizziness, altered taste and left facial paresthesias.  Symptoms started with a headache 11/19/20 and he woke up with dizziness the next day 11/20/2020.  The next day he had diplopia and nystagmus and saw urgent care.   The next day 11/22/2020 he also had nausea, vomiting.   His PCP ordered an MRI that was performed 11/23/2020.   It showed multiple lesions c/w MS in supratentoiral and infratentorial white matter. and he was sent to the ED.   Further MRI showed an enhancing lesion in the left pons/middle cerebellar peduncle.   MRI cervical spine showed 2 more lesions.  He was admitted and received 5 days of IV Slu-medrol l with improvement of the symptoms      He started [facility] April 2022  In retrospect, 3 years ago, he had numbness in the left leg with some ataxia lasting 2-3 weeks.   He notes mild visual blurring at times and was told he has mild astigmatism.      FH:   Multiple sclerosis in a maternal uncle (had severe neurologic issues starting at age 55's but not seen; became bed-bound and died), cousin (diagnosed around age 55 and doing well) and probably grandfather (progressive neurologic issues affecting gait).     Imaging studies MRI brain 09/13/2021 showed  Multiple T2/FLAIR hyperintense foci in the hemispheres, spinal cord, middle cerebellar peduncles, thalamus and cerebellum.  None of the foci enhance or appear to be acute.  There were no new lesions compared to the MRI from 11/23/2020.[name]  MRI of the brain 11/23/2020 showed multiple T2/FLAIR hyperintense foci in the hemispheres predominantly in the periventricular white matter but also in the juxtacortical and deep white matter.  Foci  were also present in the left middle cerebral peduncle, right middle cerebellar peduncle, left middle cerebellar peduncle and right cerebellar hemisphere..   The large focus in the left middle cerebellar peduncle enhanced after contrast.  None of the other foci enhanced.  MRI of the cervical spine 11/24/2020 showed posterior lesions at C2-C3 and C3-C4 consistent with demyelination.  They did not enhance.  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  There are mild degenerative changes at C3-C4, C5-C6 and C7-T1.  Laboratory tests: NMO Ab, HIV, B12, anti-Jo1, ENA, ACE, CBC/diff and CMP were negative or unremarkable. Vitamin D was reduced at  22.49  REVIEW OF SYSTEMS: Constitutional: No fevers, chills, sweats, or change in appetite Eyes: No visual changes, double vision, eye pain Ear, nose and throat: No hearing loss, ear pain, nasal congestion, sore throat Cardiovascular: No chest pain, palpitations Respiratory:  No shortness of breath at rest or with exertion.   No wheezes GastrointestinaI: No nausea, vomiting, diarrhea, abdominal pain, fecal incontinence Genitourinary:  No dysuria, urinary retention or frequency.  No nocturia. Musculoskeletal:  No neck pain, back pain Integumentary: No rash, pruritus, skin lesions Neurological: as above Psychiatric: No depression at this time.  No anxiety Endocrine: No palpitations, diaphoresis, change in appetite, change in weigh or increased thirst Hematologic/Lymphatic:  No anemia, purpura, petechiae. Allergic/Immunologic: No itchy/runny eyes, nasal congestion, recent allergic reactions, rashes  ALLERGIES: Allergies  Allergen Reactions   Gramineae Pollens    Mold Extract [Trichophyton]     HOME MEDICATIONS:  Current Outpatient Medications:    cetirizine (ZYRTEC) 10 MG tablet, Take 10 mg by mouth daily., Disp: , Rfl: 6   cholecalciferol (VITAMIN D3) 25 MCG (1000 UNIT) tablet, Take 1,000 Units by mouth daily., Disp: , Rfl:    ocrelizumab 300 mg in sodium chloride 0.9 % 250 mL, Inject 300 mg into the vein once., Disp: , Rfl:    triamcinolone ointment (KENALOG) 0.5 %, Apply 1 Application topically 2 (two) times daily., Disp: 60 g, Rfl: 3  PAST MEDICAL HISTORY: Past Medical History:  Diagnosis Date   Eczema    Hypertension    Multiple sclerosis (HCC)     PAST SURGICAL HISTORY: Past Surgical History:  Procedure Laterality Date   NO PAST SURGERIES      FAMILY HISTORY: Family History  Problem Relation Age of Onset   Hypertension Mother    Hypertension Father    Lupus Sister    Multiple sclerosis Maternal Uncle    Migraines Neg Hx    Seizures Neg Hx    Autism Neg Hx     ADD / ADHD Neg Hx    Anxiety disorder Neg Hx    Depression Neg Hx    Bipolar disorder Neg Hx    Schizophrenia Neg Hx     SOCIAL HISTORY:  Social History   Socioeconomic History   Marital status: Single    Spouse name: Not on file   Number of children: 0   Years of education: 12+   Highest education level: Not on file  Occupational History   Occupation: [employer] [place], attending college  Tobacco Use   Smoking status: Never   Smokeless tobacco: Never  Vaping Use   Vaping Use: Never used  Substance and Sexual Activity   Alcohol use: Never   Drug use: Never   Sexual activity: Not on file  Other Topics Concern   Not on file  Social History Narrative   Left handed   Caffeine use: 3-4 cups coffee per week  Lives with mom dad and sister visits she is in college. He takes Network engineer courses   Social Determinants of Corporate investment banker Strain: Not on file  Food Insecurity: Not on file  Transportation Needs: Not on file  Physical Activity: Not on file  Stress: Not on file  Social Connections: Not on file  Intimate Partner Violence: Not on file     PHYSICAL EXAM  Vitals:   03/28/23 0948  BP: 132/85  Pulse: 78  Weight: 199 lb 8 oz (90.5 kg)  Height: 5\' 6"  (1.676 m)    Body mass index is 32.2 kg/m.   No results found.    General: The patient is well-developed and well-nourished and in no acute distress  HEENT:  Head is [place]/AT.  Sclera are anicteric.   Skin: Extremities are without rash or  edema.  Mental status: The patient is alert and oriented x 3 at the time of the examination. The patient has apparent normal recent and remote memory, with an apparently normal attention span and concentration ability.   Speech is normal.  Cranial nerves: Extraocular movements are full. Colors are symmetric.   Facial strength and sensation is normal.   No obvious hearing deficits are noted.  Motor:  Muscle bulk is normal.   Tone is normal. Strength is  5 / 5 in  all 4 extremities.   Sensory: Sensory testing is intact to pinprick, soft touch and vibration sensation in all 4 extremities.  Coordination: Cerebellar testing reveals good finger-nose-finger and heel-to-shin bilaterally.  Gait and station: Station is normal.   Normal gait and tandem gaitl.  Romberg is negative.   Reflexes: Deep tendon reflexes are symmetric and normal bilaterally.        DIAGNOSTIC DATA (LABS, IMAGING, TESTING) - I reviewed patient records, labs, notes, testing and imaging myself where available.  Lab Results  Component Value Date   WBC 4.2 09/24/2022   HGB 16.2 09/24/2022   HCT 47.4 09/24/2022   MCV 90 09/24/2022   PLT 281 09/24/2022      Component Value Date/Time   NA 138 07/19/2022 0926   K 4.8 07/19/2022 0926   CL 104 07/19/2022 0926   CO2 28 07/19/2022 0926   GLUCOSE 87 07/19/2022 0926   BUN 13 07/19/2022 0926   CREATININE 1.19 07/19/2022 0926   CREATININE 0.99 11/21/2018 1406   CALCIUM 9.8 07/19/2022 0926   PROT 7.7 07/19/2022 0926   ALBUMIN 4.7 07/19/2022 0926   AST 26 07/19/2022 0926   ALT 22 07/19/2022 0926   ALKPHOS 68 07/19/2022 0926   BILITOT 0.4 07/19/2022 0926   GFRNONAA >60 11/26/2020 0541   Lab Results  Component Value Date   CHOL 196 07/19/2022   HDL 50.00 07/19/2022   LDLCALC 135 (H) 07/19/2022   TRIG 56.0 07/19/2022   CHOLHDL 4 07/19/2022   Lab Results  Component Value Date   HGBA1C 5.6 07/19/2022   Lab Results  Component Value Date   VITAMINB12 747 11/23/2020   Lab Results  Component Value Date   TSH 1.24 07/19/2022       ASSESSMENT AND PLAN  Multiple sclerosis (HCC) - Plan: MR BRAIN W WO CONTRAST, IgG, IgA, IgM, CBC with Differential/Platelet  High risk medication use  Vitamin D deficiency  Ataxic gait  1.   Continue Ocrevus.  We will check lab work.   check an MRI of the brain to determine if there is any breakthrough activity and also recheck IgM/IgG and CBC.  If the MRI shows significant  progression, consider a different DMT.  2.   Continue Vit D supplements    3.   Stay active and exercise as tolerated.    4.   RTC 6 months or sooner if new or worsening symptoms.       Clearnce Leja A. Epimenio Foot, MD, [facility] 03/28/2023, 10:34 AM Certified in Neurology, Clinical Neurophysiology, Sleep Medicine and Neuroimaging  [facility] [address] [place], [place] [address] [phone]

## 2023-03-29 LAB — CBC WITH DIFFERENTIAL/PLATELET
Basophils Absolute: 0 10*3/uL (ref 0.0–0.2)
Basos: 0 %
EOS (ABSOLUTE): 0.1 10*3/uL (ref 0.0–0.4)
Eos: 1 %
Hematocrit: 47.7 % (ref 37.5–51.0)
Hemoglobin: 15.9 g/dL (ref 13.0–17.7)
Immature Grans (Abs): 0 10*3/uL (ref 0.0–0.1)
Immature Granulocytes: 0 %
Lymphocytes Absolute: 1.2 10*3/uL (ref 0.7–3.1)
Lymphs: 24 %
MCH: 30.2 pg (ref 26.6–33.0)
MCHC: 33.3 g/dL (ref 31.5–35.7)
MCV: 91 fL (ref 79–97)
Monocytes Absolute: 0.6 10*3/uL (ref 0.1–0.9)
Monocytes: 12 %
Neutrophils Absolute: 3.1 10*3/uL (ref 1.4–7.0)
Neutrophils: 63 %
Platelets: 289 10*3/uL (ref 150–450)
RBC: 5.27 x10E6/uL (ref 4.14–5.80)
RDW: 12.4 % (ref 11.6–15.4)
WBC: 5 10*3/uL (ref 3.4–10.8)

## 2023-03-29 LAB — IGG, IGA, IGM
IgA/Immunoglobulin A, Serum: 177 mg/dL (ref 90–386)
IgG (Immunoglobin G), Serum: 1022 mg/dL (ref 603–1613)
IgM (Immunoglobulin M), Srm: 10 mg/dL — ABNORMAL LOW (ref 20–172)

## 2023-04-03 ENCOUNTER — Ambulatory Visit (INDEPENDENT_AMBULATORY_CARE_PROVIDER_SITE_OTHER): Payer: BC Managed Care – PPO

## 2023-04-03 DIAGNOSIS — G35 Multiple sclerosis: Secondary | ICD-10-CM

## 2023-04-03 MED ORDER — GADOBENATE DIMEGLUMINE 529 MG/ML IV SOLN
20.0000 mL | Freq: Once | INTRAVENOUS | Status: AC | PRN
Start: 2023-04-03 — End: 2023-04-03
  Administered 2023-04-03: 20 mL via INTRAVENOUS

## 2023-04-04 ENCOUNTER — Telehealth: Payer: Self-pay | Admitting: *Deleted

## 2023-04-04 NOTE — Telephone Encounter (Signed)
Patient informed with all the below, new order signed by Dr.Sater. Order given to [facility] in [facility].

## 2023-04-04 NOTE — Telephone Encounter (Signed)
-----   Message from Asa Lente, MD sent at 04/04/2023  8:33 AM EDT ----- His IgM levels are low.  I would like to change the Ocrevus dose to every 8 months.

## 2023-07-25 ENCOUNTER — Ambulatory Visit: Payer: BC Managed Care – PPO | Admitting: Family Medicine

## 2023-07-25 ENCOUNTER — Encounter: Payer: Self-pay | Admitting: Family Medicine

## 2023-07-25 VITALS — BP 138/86 | HR 64 | Temp 97.8°F | Ht 66.0 in | Wt 195.8 lb

## 2023-07-25 DIAGNOSIS — L309 Dermatitis, unspecified: Secondary | ICD-10-CM

## 2023-07-25 DIAGNOSIS — Z23 Encounter for immunization: Secondary | ICD-10-CM

## 2023-07-25 DIAGNOSIS — J309 Allergic rhinitis, unspecified: Secondary | ICD-10-CM

## 2023-07-25 DIAGNOSIS — G35 Multiple sclerosis: Secondary | ICD-10-CM

## 2023-07-25 DIAGNOSIS — Z0000 Encounter for general adult medical examination without abnormal findings: Secondary | ICD-10-CM

## 2023-07-25 DIAGNOSIS — E785 Hyperlipidemia, unspecified: Secondary | ICD-10-CM

## 2023-07-25 DIAGNOSIS — E559 Vitamin D deficiency, unspecified: Secondary | ICD-10-CM

## 2023-07-25 DIAGNOSIS — Z0001 Encounter for general adult medical examination with abnormal findings: Secondary | ICD-10-CM

## 2023-07-25 MED ORDER — TRIAMCINOLONE ACETONIDE 0.5 % EX OINT: 1.0000 | TOPICAL_OINTMENT | Freq: Two times a day (BID) | CUTANEOUS | 3 refills | Status: DC

## 2023-07-25 NOTE — Assessment & Plan Note (Signed)
Stable on take 10 mg daily.

## 2023-07-25 NOTE — Assessment & Plan Note (Signed)
On vitamin D supplementation.  We can check labs next year.

## 2023-07-25 NOTE — Progress Notes (Signed)
Chief Complaint:  Sean Fox. is a 22 y.o. male who presents today for his annual comprehensive physical exam.    Assessment/Plan:  Chronic Problems Addressed Today: Eczema Uses triamcinolone as needed.  Will refill today.  Has upcoming appointment with dermatology to discuss striae in right antecubital fossa as well as eczema.  Multiple sclerosis (HCC) Following with neurology.  Doing well on Ocrevus.  No flares.  Recent MRI was stable compared to previous.  No side effects with medication.  He will continue management per neurology.  Allergic rhinitis Stable on take 10 mg daily.  Vitamin D deficiency On vitamin D supplementation.  We can check labs next year.  Dyslipidemia Discussed lifestyle modifications.  Deferred labs today.  Will recheck next year.  Preventative Healthcare: Flu shot given today.  Deferred checking labs -we can check next year.  Up-to-date on other vaccines and screenings.  Patient Counseling(The following topics were reviewed and/or handout was given):  -Nutrition: Stressed importance of moderation in sodium/caffeine intake, saturated fat and cholesterol, caloric balance, sufficient intake of fresh fruits, vegetables, and fiber.  -Stressed the importance of regular exercise.   -Substance Abuse: Discussed cessation/primary prevention of tobacco, alcohol, or other drug use; driving or other dangerous activities under the influence; availability of treatment for abuse.   -Injury prevention: Discussed safety belts, safety helmets, smoke detector, smoking near bedding or upholstery.   -Sexuality: Discussed sexually transmitted diseases, partner selection, use of condoms, avoidance of unintended pregnancy and contraceptive alternatives.   -Dental health: Discussed importance of regular tooth brushing, flossing, and dental visits.  -Health maintenance and immunizations reviewed. Please refer to Health maintenance section.  Return to care in 1 year for next  preventative visit.     Subjective:  HPI:  He has no acute complaints today.  He has been following with neurology for his MS.  On Ocrevus.  Doing well.  He is now getting an infusion every 8 months.  Had recent MRI which did not show any progression of disease.  He is having more issues with eczema that has not been controlled with triamcinolone and has upcoming appoint with dermatology in a couple of months.  Also has dark lesion in his right elbow that has been present for several years he would like to discuss with dermatology.  Lifestyle Diet: None specific.  Exercise: None specific.      07/25/2023    8:53 AM  Depression screen PHQ 2/9  Decreased Interest 1  Down, Depressed, Hopeless 0  PHQ - 2 Score 1  Altered sleeping 1  Tired, decreased energy 3  Change in appetite 0  Feeling bad or failure about yourself  0  Trouble concentrating 1  Moving slowly or fidgety/restless 0  Suicidal thoughts 0  PHQ-9 Score 6  Difficult doing work/chores Somewhat difficult    There are no preventive care reminders to display for this patient.    ROS: Per HPI, otherwise a complete review of systems was negative.   PMH:  The following were reviewed and entered/updated in epic: Past Medical History:  Diagnosis Date   Eczema    Hypertension    Multiple sclerosis (HCC)    Patient Active Problem List   Diagnosis Date Noted   Eczema 07/19/2022   Dyslipidemia 07/19/2022   High risk medication use 12/12/2020   Mild depression 12/12/2020   Multiple sclerosis (HCC) 11/23/2020   Allergic rhinitis 04/12/2020   Vitamin D deficiency 01/02/2019   Past Surgical History:  Procedure Laterality Date  NO PAST SURGERIES      Family History  Problem Relation Age of Onset   Hypertension Mother    Hypertension Father    Lupus Sister    Multiple sclerosis Maternal Uncle    Migraines Neg Hx    Seizures Neg Hx    Autism Neg Hx    ADD / ADHD Neg Hx    Anxiety disorder Neg Hx     Depression Neg Hx    Bipolar disorder Neg Hx    Schizophrenia Neg Hx     Medications- reviewed and updated Current Outpatient Medications  Medication Sig Dispense Refill   cetirizine (ZYRTEC) 10 MG tablet Take 10 mg by mouth daily.  6   cholecalciferol (VITAMIN D3) 25 MCG (1000 UNIT) tablet Take 1,000 Units by mouth daily.     ocrelizumab 300 mg in sodium chloride 0.9 % 250 mL Inject 300 mg into the vein once.     triamcinolone ointment (KENALOG) 0.5 % Apply 1 Application topically 2 (two) times daily. 60 g 3   No current facility-administered medications for this visit.    Allergies-reviewed and updated Allergies  Allergen Reactions   Gramineae Pollens    Mold Extract [Trichophyton]     Social History   Socioeconomic History   Marital status: Single    Spouse name: Not on file   Number of children: 0   Years of education: 12+   Highest education level: Not on file  Occupational History   Occupation: [employer] [place], attending college  Tobacco Use   Smoking status: Never   Smokeless tobacco: Never  Vaping Use   Vaping status: Never Used  Substance and Sexual Activity   Alcohol use: Never   Drug use: Never   Sexual activity: Not on file  Other Topics Concern   Not on file  Social History Narrative   Left handed   Caffeine use: 3-4 cups coffee per week   Lives with mom dad and sister visits she is in college. He takes Network engineer courses   Social Determinants of Corporate investment banker Strain: Not on file  Food Insecurity: Not on file  Transportation Needs: Not on file  Physical Activity: Not on file  Stress: Not on file  Social Connections: Not on file        Objective:  Physical Exam: BP 138/86   Pulse 64   Temp 97.8 F (36.6 C) (Temporal)   Ht 5\' 6"  (1.676 m)   Wt 195 lb 12.8 oz (88.8 kg)   SpO2 99%   BMI 31.60 kg/m   Body mass index is 31.6 kg/m. Wt Readings from Last 3 Encounters:  07/25/23 195 lb 12.8 oz (88.8 kg)  03/28/23 199 lb 8 oz  (90.5 kg)  09/24/22 195 lb 8 oz (88.7 kg)   Gen: NAD, resting comfortably HEENT: TMs normal bilaterally. OP clear. No thyromegaly noted.  CV: RRR with no murmurs appreciated Pulm: NWOB, CTAB with no crackles, wheezes, or rhonchi GI: Normal bowel sounds present. Soft, Nontender, Nondistended. MSK: no edema, cyanosis, or clubbing noted Skin: warm, dry.  Scattered eczematous patches noted on upper extremity and abdomen.  Hyperpigmented striae in right antecubital fossa. Neuro: CN2-12 grossly intact. Strength 5/5 in upper and lower extremities. Reflexes symmetric and intact bilaterally.  Psych: Normal affect and thought content     Sean Fox M. Jimmey Ralph, MD 07/25/2023 9:27 AM

## 2023-07-25 NOTE — Patient Instructions (Signed)
It was very nice to see you today!  We gave you your flu shot today.  We can check blood work next year.  I will refill your triamcinolone.  Please continue to work on diet and exercise.  Try to get about 30 minutes of exercise per day.  Will see you back in a year for your next physical.  Come back sooner if needed.  Return in about 1 year (around 07/24/2024) for Annual Physical.   Take care, Dr Jimmey Ralph  PLEASE NOTE:  If you had any lab tests, please let us know if you have not heard back within a few days. You may see your results on mychart before we have a chance to review them but we will give you a call once they are reviewed by [place].   If we ordered any referrals today, please let us know if you have not heard from their office within the next week.   If you had any urgent prescriptions sent in today, please check with the pharmacy within an hour of our visit to make sure the prescription was transmitted appropriately.   Please try these tips to maintain a healthy lifestyle:  Eat at least 3 REAL meals and 1-2 snacks per day.  Aim for no more than 5 hours between eating.  If you eat breakfast, please do so within one hour of getting up.   Each meal should contain half fruits/vegetables, one quarter protein, and one quarter carbs (no bigger than a computer mouse)  Cut down on sweet beverages. This includes juice, soda, and sweet tea.   Drink at least 1 glass of water with each meal and aim for at least 8 glasses per day  Exercise at least 150 minutes every week.     Preventive Care 15-39 Years Old, Male Preventive care refers to lifestyle choices and visits with your health care provider that can promote health and wellness. Preventive care visits are also called wellness exams. What can I expect for my preventive care visit? Counseling During your preventive care visit, your health care provider may ask about your: Medical history, including: Past medical problems. Family  medical history. Current health, including: Emotional well-being. Home life and relationship well-being. Sexual activity. Lifestyle, including: Alcohol, nicotine or tobacco, and drug use. Access to firearms. Diet, exercise, and sleep habits. Safety issues such as seatbelt and bike helmet use. Sunscreen use. Work and work Astronomer. Physical exam Your health care provider may check your: Height and weight. These may be used to calculate your BMI (body mass index). BMI is a measurement that tells if you are at a healthy weight. Waist circumference. This measures the distance around your waistline. This measurement also tells if you are at a healthy weight and may help predict your risk of certain diseases, such as type 2 diabetes and high blood pressure. Heart rate and blood pressure. Body temperature. Skin for abnormal spots. What immunizations do I need?  Vaccines are usually given at various ages, according to a schedule. Your health care provider will recommend vaccines for you based on your age, medical history, and lifestyle or other factors, such as travel or where you work. What tests do I need? Screening Your health care provider may recommend screening tests for certain conditions. This may include: Lipid and cholesterol levels. Diabetes screening. This is done by checking your blood sugar (glucose) after you have not eaten for a while (fasting). Hepatitis B test. Hepatitis C test. HIV (human immunodeficiency virus) test. STI (  sexually transmitted infection) testing, if you are at risk. Talk with your health care provider about your test results, treatment options, and if necessary, the need for more tests. Follow these instructions at home: Eating and drinking  Eat a healthy diet that includes fresh fruits and vegetables, whole grains, lean protein, and low-fat dairy products. Drink enough fluid to keep your urine pale yellow. Take vitamin and mineral supplements as  recommended by your health care provider. Do not drink alcohol if your health care provider tells you not to drink. If you drink alcohol: Limit how much you have to 0-2 drinks a day. Know how much alcohol is in your drink. In the [place] one drink equals one 12 oz bottle of beer (355 mL), one 5 oz glass of wine (148 mL), or one 1 oz glass of hard liquor (44 mL). Lifestyle Brush your teeth every morning and night with fluoride toothpaste. Floss one time each day. Exercise for at least 30 minutes 5 or more days each week. Do not use any products that contain nicotine or tobacco. These products include cigarettes, chewing tobacco, and vaping devices, such as e-cigarettes. If you need help quitting, ask your health care provider. Do not use drugs. If you are sexually active, practice safe sex. Use a condom or other form of protection to prevent STIs. Find healthy ways to manage stress, such as: Meditation, yoga, or listening to music. Journaling. Talking to a trusted person. Spending time with friends and family. Minimize exposure to UV radiation to reduce your risk of skin cancer. Safety Always wear your seat belt while driving or riding in a vehicle. Do not drive: If you have been drinking alcohol. Do not ride with someone who has been drinking. If you have been using any mind-altering substances or drugs. While texting. When you are tired or distracted. Wear a helmet and other protective equipment during sports activities. If you have firearms in your house, make sure you follow all gun safety procedures. Seek help if you have been physically or sexually abused. What's next? Go to your health care provider once a year for an annual wellness visit. Ask your health care provider how often you should have your eyes and teeth checked. Stay up to date on all vaccines. This information is not intended to replace advice given to you by your health care provider. Make sure you discuss any  questions you have with your health care provider. Document Revised: 03/29/2021 Document Reviewed: 03/29/2021 Elsevier Patient Education  2024 [employer].

## 2023-07-25 NOTE — Assessment & Plan Note (Signed)
Uses triamcinolone as needed.  Will refill today.  Has upcoming appointment with dermatology to discuss striae in right antecubital fossa as well as eczema.

## 2023-07-25 NOTE — Assessment & Plan Note (Signed)
Discussed lifestyle modifications.  Deferred labs today.  Will recheck next year.

## 2023-07-25 NOTE — Assessment & Plan Note (Addendum)
Following with neurology.  Doing well on Ocrevus.  No flares.  Recent MRI was stable compared to previous.  No side effects with medication.  He will continue management per neurology.

## 2023-09-26 NOTE — Progress Notes (Signed)
Chief Complaint  Patient presents with   Room 1    Pt is here with his mother. Pt states that things have been going well with is MS since his last appointment. Pt denies any new symptoms or concerns to discuss today.     HISTORY OF PRESENT ILLNESS:  09/30/23 ALL:  Daytron Gulan. is a 22 y.o. male here today for follow up for RRMS. He continues Ovrevus infusions and tolerating well. Labs have been stable with exception of IgM. Now getting infusions every 8 months. Last MR brain stable 03/2023.   He reports doing well. No new or exacerbating symptoms. Gait is stable. No difficulty with stairs. He does not use assistive device. He denies diplopia.   Mood is good. Occasionally irritable. He is sleeping well. Sometimes has difficulty getting to sleep. He like to play video games before bed. He does continue to note cognitive deficits of inattention and word finding difficulty but not overly bothered by this. He has not been as active, recently.   No bowel or bladder changes.   Vitamin D was 37 07/2022. He continues vitamin D supplements 1000iu daily.   HISTORY (copied from Dr Bonnita Hollow previous note)  Johathon Peragine is a 22 y.o. man with relapsing remitting multiple sclerosis.   UPDATE 03/22/2022: He is on Ocrevus (last infusion 02/13/2023).    He tolerates infusionsvery well.[name] He denies any exacerbations or new symptoms.       He has no new neurologic symptoms.  He denies any difficulty with his gait, strength or sensation.   Balance is back to baseline but he still feels better using the bannister. [name]   He keeps up with his peers most of the time.  However, he gets fatigued more quickly if more active.     He denies diplopia or other visual issue.     He has nocturia 3-4 times a night.   He denies urgency during the day.  He occasionally has hesitancy,   He still notes fatigue, most days.   He sleeps well most nights with 7-8 hours      He has noted  mild cognitive issues and notes  decreased  focus/attention and word finding issues at school.     He had noted mild depression/anxiety at diagnosis but now feels fine.      He takes vit D daily   MS HISTORY: He presented to [facility] 11/23/2020 with double vision, nystagmus, dizziness, altered taste and left facial paresthesias.  Symptoms started with a headache 11/19/20 and he woke up with dizziness the next day 11/20/2020.  The next day he had diplopia and nystagmus and saw urgent care.   The next day 11/22/2020 he also had nausea, vomiting.   His PCP ordered an MRI that was performed 11/23/2020.  It showed multiple lesions c/w MS in supratentoiral and infratentorial white matter. and he was sent to the ED.   Further MRI showed an enhancing lesion in the left pons/middle cerebellar peduncle.   MRI cervical spine showed 2 more lesions.  He was admitted and received 5 days of IV Slu-medrol l with improvement of the symptoms      He started [facility] April 2022   In retrospect, 3 years ago, he had numbness in the left leg with some ataxia lasting 2-3 weeks.   He notes mild visual blurring at times and was told he has mild astigmatism.      FH:   Multiple sclerosis in a  maternal uncle (had severe neurologic issues starting at age 67's but not seen; became bed-bound and died), cousin (diagnosed around age 37 and doing well) and probably grandfather (progressive neurologic issues affecting gait).     Imaging studies MRI brain 09/13/2021 showed  Multiple T2/FLAIR hyperintense foci in the hemispheres, spinal cord, middle cerebellar peduncles, thalamus and cerebellum.  None of the foci enhance or appear to be acute.  There were no new lesions compared to the MRI from 11/23/2020.[name]   MRI of the brain 11/23/2020 showed multiple T2/FLAIR hyperintense foci in the hemispheres predominantly in the periventricular white matter but also in the juxtacortical and deep white matter.  Foci were also present in the left middle cerebral peduncle, right middle cerebellar  peduncle, left middle cerebellar peduncle and right cerebellar hemisphere..   The large focus in the left middle cerebellar peduncle enhanced after contrast.  None of the other foci enhanced.   MRI of the cervical spine 11/24/2020 showed posterior lesions at C2-C3 and C3-C4 consistent with demyelination.  They did not enhance.  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  There are mild degenerative changes at C3-C4, C5-C6 and C7-T1.   Laboratory tests: NMO Ab, HIV, B12, anti-Jo1, ENA, ACE, CBC/diff and CMP were negative or unremarkable. Vitamin D was reduced at 22.49   REVIEW OF SYSTEMS: Out of a complete 14 system review of symptoms, the patient complains only of the following symptoms, word finding difficulty, inattention nd all other reviewed systems are negative.   ALLERGIES: Allergies  Allergen Reactions   Gramineae Pollens    Mold Extract [Trichophyton]      HOME MEDICATIONS: Outpatient Medications Prior to Visit  Medication Sig Dispense Refill   cetirizine (ZYRTEC) 10 MG tablet Take 10 mg by mouth daily.  6   cholecalciferol (VITAMIN D3) 25 MCG (1000 UNIT) tablet Take 1,000 Units by mouth daily.     ISOtretinoin (ACCUTANE) 40 MG capsule Take 40 mg by mouth daily.     ocrelizumab 300 mg in sodium chloride 0.9 % 250 mL Inject 300 mg into the vein once.     Omega-3 Fatty Acids (FISH OIL) 1200 MG CAPS Take by mouth.     triamcinolone ointment (KENALOG) 0.5 % Apply 1 Application topically 2 (two) times daily. (Patient not taking: Reported on 09/30/2023) 60 g 3   No facility-administered medications prior to visit.     PAST MEDICAL HISTORY: Past Medical History:  Diagnosis Date   Eczema    Hypertension    Multiple sclerosis (HCC)      PAST SURGICAL HISTORY: Past Surgical History:  Procedure Laterality Date   NO PAST SURGERIES       FAMILY HISTORY: Family History  Problem Relation Age of Onset   Hypertension Mother    Hypertension Father    Lupus Sister    Multiple  sclerosis Maternal Uncle    Migraines Neg Hx    Seizures Neg Hx    Autism Neg Hx    ADD / ADHD Neg Hx    Anxiety disorder Neg Hx    Depression Neg Hx    Bipolar disorder Neg Hx    Schizophrenia Neg Hx      SOCIAL HISTORY: Social History   Socioeconomic History   Marital status: Single    Spouse name: Not on file   Number of children: 0   Years of education: 12+   Highest education level: Not on file  Occupational History   Occupation: [employer] [place], attending college  Tobacco Use  Smoking status: Never   Smokeless tobacco: Never  Vaping Use   Vaping status: Never Used  Substance and Sexual Activity   Alcohol use: Never   Drug use: Never   Sexual activity: Not on file  Other Topics Concern   Not on file  Social History Narrative   Left handed   Caffeine use: 3-4 cups coffee per week   Lives with mom dad and sister visits she is in college. He takes Network engineer courses   Social Drivers of Corporate investment banker Strain: Not on file  Food Insecurity: Not on file  Transportation Needs: Not on file  Physical Activity: Not on file  Stress: Not on file  Social Connections: Not on file  Intimate Partner Violence: Not on file     PHYSICAL EXAM  Vitals:   09/30/23 1309  BP: (!) 148/82  Pulse: 71  Weight: 197 lb (89.4 kg)  Height: 5\' 6"  (1.676 m)    Body mass index is 31.8 kg/m.  Generalized: Well developed, in no acute distress  Cardiology: normal rate and rhythm, no murmur auscultated  Respiratory: clear to auscultation bilaterally    Neurological examination  Mentation: Alert oriented to time, place, history taking. Follows all commands speech and language fluent Cranial nerve II-XII: Pupils were equal round reactive to light. Extraocular movements were full, visual field were full on confrontational test. Facial sensation and strength were normal. Head turning and shoulder shrug  were normal and symmetric. Motor: The motor testing reveals 5 over 5  strength of all 4 extremities. Good symmetric motor tone is noted throughout.  Sensory: Sensory testing is intact to soft touch on all 4 extremities. No evidence of extinction is noted.  Coordination: Cerebellar testing reveals good finger-nose-finger and heel-to-shin bilaterally.  Gait and station: Gait is normal.  Reflexes: Deep tendon reflexes are symmetric and normal bilaterally.    DIAGNOSTIC DATA (LABS, IMAGING, TESTING) - I reviewed patient records, labs, notes, testing and imaging myself where available.  Lab Results  Component Value Date   WBC 5.0 03/28/2023   HGB 15.9 03/28/2023   HCT 47.7 03/28/2023   MCV 91 03/28/2023   PLT 289 03/28/2023      Component Value Date/Time   NA 138 07/19/2022 0926   K 4.8 07/19/2022 0926   CL 104 07/19/2022 0926   CO2 28 07/19/2022 0926   GLUCOSE 87 07/19/2022 0926   BUN 13 07/19/2022 0926   CREATININE 1.19 07/19/2022 0926   CREATININE 0.99 11/21/2018 1406   CALCIUM 9.8 07/19/2022 0926   PROT 7.7 07/19/2022 0926   ALBUMIN 4.7 07/19/2022 0926   AST 26 07/19/2022 0926   ALT 22 07/19/2022 0926   ALKPHOS 68 07/19/2022 0926   BILITOT 0.4 07/19/2022 0926   GFRNONAA >60 11/26/2020 0541   Lab Results  Component Value Date   CHOL 196 07/19/2022   HDL 50.00 07/19/2022   LDLCALC 135 (H) 07/19/2022   TRIG 56.0 07/19/2022   CHOLHDL 4 07/19/2022   Lab Results  Component Value Date   HGBA1C 5.6 07/19/2022   Lab Results  Component Value Date   VITAMINB12 747 11/23/2020   Lab Results  Component Value Date   TSH 1.24 07/19/2022        No data to display               No data to display           ASSESSMENT AND PLAN  22 y.o. year old male  has a past medical history of Eczema, Hypertension, and Multiple sclerosis (HCC). here with    Relapsing remitting multiple sclerosis (HCC) - Plan: CBC with Differential/Platelets, IgG, IgA, IgM  High risk medication use  Vitamin D deficiency - Plan: Vitamin D,  25-hydroxy  Ataxic gait  Mild depression  [employer]. Is doing well. We will continue Ocrevus infusions every 8 months. We will update labs. Healthy lifestyle habits encouraged. He will follow up with PCP as directed. Consider increasing exercise and reducing screen time to help with intermittent irritability and insomnia. He will return to see Dr Epimenio Foot in 6 months, sooner if needed. She verbalizes understanding and agreement with this plan.   Orders Placed This Encounter  Procedures   CBC with Differential/Platelets   IgG, IgA, IgM   Vitamin D, 25-hydroxy     No orders of the defined types were placed in this encounter.    Shawnie Dapper, MSN, FNP-C 09/30/2023, 1:34 PM  [facility] [address] [place], [place] [address] [phone]

## 2023-09-26 NOTE — Patient Instructions (Signed)
Below is our plan:  We will continue current plan. Try to reduce screen time before bed. Increase activity and add in 20 minute exercise regimen.   Please make sure you are staying well hydrated. I recommend 50-60 ounces daily. Well balanced diet and regular exercise encouraged. Consistent sleep schedule with 6-8 hours recommended.   Please continue follow up with care team as directed.   Follow up with Dr Epimenio Foot in 6 months   You may receive a survey regarding today's visit. I encourage you to leave honest feed back as I do use this information to improve patient care. Thank you for seeing me today!

## 2023-09-30 ENCOUNTER — Encounter: Payer: Self-pay | Admitting: Family Medicine

## 2023-09-30 ENCOUNTER — Ambulatory Visit: Payer: BC Managed Care – PPO | Admitting: Family Medicine

## 2023-09-30 VITALS — BP 148/82 | HR 71 | Ht 66.0 in | Wt 197.0 lb

## 2023-09-30 DIAGNOSIS — R260 Ataxic gait: Secondary | ICD-10-CM

## 2023-09-30 DIAGNOSIS — F32A Depression, unspecified: Secondary | ICD-10-CM

## 2023-09-30 DIAGNOSIS — E559 Vitamin D deficiency, unspecified: Secondary | ICD-10-CM

## 2023-09-30 DIAGNOSIS — G35 Multiple sclerosis: Secondary | ICD-10-CM

## 2023-09-30 DIAGNOSIS — Z79899 Other long term (current) drug therapy: Secondary | ICD-10-CM

## 2023-10-01 LAB — CBC WITH DIFFERENTIAL/PLATELET
Basophils Absolute: 0 10*3/uL (ref 0.0–0.2)
Basos: 0 %
EOS (ABSOLUTE): 0.1 10*3/uL (ref 0.0–0.4)
Eos: 1 %
Hematocrit: 47.3 % (ref 37.5–51.0)
Hemoglobin: 15.8 g/dL (ref 13.0–17.7)
Immature Grans (Abs): 0 10*3/uL (ref 0.0–0.1)
Immature Granulocytes: 0 %
Lymphocytes Absolute: 1.6 10*3/uL (ref 0.7–3.1)
Lymphs: 32 %
MCH: 31 pg (ref 26.6–33.0)
MCHC: 33.4 g/dL (ref 31.5–35.7)
MCV: 93 fL (ref 79–97)
Monocytes Absolute: 0.4 10*3/uL (ref 0.1–0.9)
Monocytes: 9 %
Neutrophils Absolute: 2.8 10*3/uL (ref 1.4–7.0)
Neutrophils: 58 %
Platelets: 290 10*3/uL (ref 150–450)
RBC: 5.1 x10E6/uL (ref 4.14–5.80)
RDW: 11.7 % (ref 11.6–15.4)
WBC: 4.9 10*3/uL (ref 3.4–10.8)

## 2023-10-01 LAB — IGG, IGA, IGM
IgA/Immunoglobulin A, Serum: 175 mg/dL (ref 90–386)
IgG (Immunoglobin G), Serum: 1043 mg/dL (ref 603–1613)
IgM (Immunoglobulin M), Srm: 13 mg/dL — ABNORMAL LOW (ref 20–172)

## 2023-10-01 LAB — VITAMIN D 25 HYDROXY (VIT D DEFICIENCY, FRACTURES): Vit D, 25-Hydroxy: 24.1 ng/mL — ABNORMAL LOW (ref 30.0–100.0)

## 2023-12-16 ENCOUNTER — Telehealth: Payer: Self-pay

## 2023-12-16 NOTE — Telephone Encounter (Signed)
 Faxed in

## 2024-03-05 ENCOUNTER — Encounter: Payer: Self-pay | Admitting: Family Medicine

## 2024-04-01 ENCOUNTER — Encounter: Payer: Self-pay | Admitting: Family Medicine

## 2024-04-16 ENCOUNTER — Encounter: Payer: Self-pay | Admitting: Neurology

## 2024-04-16 ENCOUNTER — Ambulatory Visit: Payer: BC Managed Care – PPO | Admitting: Neurology

## 2024-04-16 VITALS — BP 148/85 | HR 86 | Ht 66.0 in | Wt 203.0 lb

## 2024-04-16 DIAGNOSIS — R260 Ataxic gait: Secondary | ICD-10-CM | POA: Diagnosis not present

## 2024-04-16 DIAGNOSIS — G35A Relapsing-remitting multiple sclerosis: Secondary | ICD-10-CM

## 2024-04-16 DIAGNOSIS — F32A Depression, unspecified: Secondary | ICD-10-CM

## 2024-04-16 DIAGNOSIS — Z79899 Other long term (current) drug therapy: Secondary | ICD-10-CM | POA: Diagnosis not present

## 2024-04-16 DIAGNOSIS — G35 Multiple sclerosis: Secondary | ICD-10-CM | POA: Diagnosis not present

## 2024-04-16 DIAGNOSIS — E559 Vitamin D deficiency, unspecified: Secondary | ICD-10-CM

## 2024-04-16 DIAGNOSIS — G35D Multiple sclerosis, unspecified: Secondary | ICD-10-CM

## 2024-04-16 NOTE — Progress Notes (Addendum)
 "  [facility]  PATIENT: Sean Fox. DOB: 02-Nov-2000  REFERRING DOCTOR OR PCP:  Worth Kitty, MD / Mimi Ny, NP SOURCE: Patient, notes from recent hospital admission, imaging and lab results, MRI images personally reviewed.  _________________________________   HISTORICAL  CHIEF COMPLAINT:  Chief Complaint  Patient presents with   RM10/MS    Pt is here with his Mother. Pt states that he has been stable. Pt is on Ocrevus .     HISTORY OF PRESENT ILLNESS:  Sean Fox is a 23 y.o. man with relapsing remitting multiple sclerosis.  UPDATE : He is on Ocrevus  .    He tolerates the infusions well though has some throat itching.  His next infusion is 06/08/2024.    He denies any exacerbations or new symptoms.      He has no new neurologic symptoms.  He denies any difficulty with his gait, strength or sensation.   Balance is mildly off and he is  using the bannister on stairs.   He keeps up with his peers most of the time.  However, he gets fatigued more quickly if more active.     He denies diplopia or other visual issue.    He denies urgency during the day.  He occasionally has hesitancy,   He has nocturia once a night.    He still notes fatigue, most days.   He sleeps well most nights with 7-8 hours      He has noted  mild cognitive issues and notes  decreased focus/attention and word finding issues at school.     He had noted mild depression/anxiety at diagnosis but now feels fine.   He feels this fluctuates some.  He takes vit D daily   MS HISTORY: He presented to [facility] 11/23/2020 with double vision, nystagmus, dizziness, altered taste and left facial paresthesias.  Symptoms started with a headache 11/19/20 and he woke up with dizziness the next day 11/20/2020.  The next day he had diplopia and nystagmus and saw urgent care.   The next day 11/22/2020 he also had nausea, vomiting.   His PCP ordered an MRI that was performed 11/23/2020.  It showed multiple  lesions c/w MS in supratentoiral and infratentorial white matter. and he was sent to the ED.   Further MRI showed an enhancing lesion in the left pons/middle cerebellar peduncle.   MRI cervical spine showed 2 more lesions.  He was admitted and received 5 days of IV Slu-medrol  l with improvement of the symptoms      He started Ocrevus  April 2022  In retrospect, 3 years ago, he had numbness in the left leg with some ataxia lasting 2-3 weeks.   He notes mild visual blurring at times and was told he has mild astigmatism.      FH:   Multiple sclerosis in a maternal uncle (had severe neurologic issues starting at age 57's but not seen; became bed-bound and died), cousin (diagnosed around age 83 and doing well) and probably grandfather (progressive neurologic issues affecting gait).       Imaging studies MRI 04/03/2023 MRI brain was stable.  MRI brain 09/13/2021 showed  Multiple T2/FLAIR hyperintense foci in the hemispheres, spinal cord, middle cerebellar peduncles, thalamus and cerebellum.  None of the foci enhance or appear to be acute.  There were no new lesions compared to the MRI from 11/23/2020.SABRA  MRI of the brain 11/23/2020 showed multiple T2/FLAIR hyperintense foci in the hemispheres predominantly in the periventricular white  matter but also in the juxtacortical and deep white matter.  Foci were also present in the left middle cerebral peduncle, right middle cerebellar peduncle, left middle cerebellar peduncle and right cerebellar hemisphere..   The large focus in the left middle cerebellar peduncle enhanced after contrast.  None of the other foci enhanced.  MRI of the cervical spine 11/24/2020 showed posterior lesions at C2-C3 and C3-C4 consistent with demyelination.  They did not enhance.  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  There are mild degenerative changes at C3-C4, C5-C6 and C7-T1.  Laboratory tests: NMO Ab, HIV, B12, anti-Jo1, ENA, ACE, CBC/diff and CMP were negative or unremarkable. Vitamin D   was reduced at 22.49  REVIEW OF SYSTEMS: Constitutional: No fevers, chills, sweats, or change in appetite Eyes: No visual changes, double vision, eye pain Ear, nose and throat: No hearing loss, ear pain, nasal congestion, sore throat Cardiovascular: No chest pain, palpitations Respiratory:  No shortness of breath at rest or with exertion.   No wheezes GastrointestinaI: No nausea, vomiting, diarrhea, abdominal pain, fecal incontinence Genitourinary:  No dysuria, urinary retention or frequency.  No nocturia. Musculoskeletal:  No neck pain, back pain Integumentary: No rash, pruritus, skin lesions Neurological: as above Psychiatric: No depression at this time.  No anxiety Endocrine: No palpitations, diaphoresis, change in appetite, change in weigh or increased thirst Hematologic/Lymphatic:  No anemia, purpura, petechiae. Allergic/Immunologic: No itchy/runny eyes, nasal congestion, recent allergic reactions, rashes  ALLERGIES: Allergies  Allergen Reactions   Gramineae Pollens    Mold Extract [Trichophyton]     HOME MEDICATIONS:  Current Outpatient Medications:    cetirizine (ZYRTEC) 10 MG tablet, Take 10 mg by mouth daily., Disp: , Rfl: 6   cholecalciferol (VITAMIN D3) 25 MCG (1000 UNIT) tablet, Take 1,000 Units by mouth daily., Disp: , Rfl:    ocrelizumab  300 mg in sodium chloride  0.9 % 250 mL, Inject 300 mg into the vein once., Disp: , Rfl:    pyridoxine (B-6) 100 MG tablet, Take 100 mg by mouth., Disp: , Rfl:    triamcinolone  ointment (KENALOG ) 0.5 %, Apply 1 Application topically 2 (two) times daily., Disp: 60 g, Rfl: 3   Vitamin D , Ergocalciferol , (DRISDOL ) 1.25 MG (50000 UNIT) CAPS capsule, Take 50,000 Units by mouth once a week., Disp: , Rfl:   PAST MEDICAL HISTORY: Past Medical History:  Diagnosis Date   Eczema    Hypertension    Multiple sclerosis     PAST SURGICAL HISTORY: Past Surgical History:  Procedure Laterality Date   NO PAST SURGERIES      FAMILY  HISTORY: Family History  Problem Relation Age of Onset   Hypertension Mother    Hypertension Father    Lupus Sister    Multiple sclerosis Maternal Uncle    Multiple sclerosis Paternal Cousin    Migraines Neg Hx    Seizures Neg Hx    Autism Neg Hx    ADD / ADHD Neg Hx    Anxiety disorder Neg Hx    Depression Neg Hx    Bipolar disorder Neg Hx    Schizophrenia Neg Hx     SOCIAL HISTORY:  Social History   Socioeconomic History   Marital status: Single    Spouse name: Not on file   Number of children: 0   Years of education: 12+   Highest education level: Not on file  Occupational History   Occupation: [employer], attending college  Tobacco Use   Smoking status: Never   Smokeless tobacco: Never  Vaping Use   Vaping status: Never Used  Substance and Sexual Activity   Alcohol use: Never   Drug use: Never   Sexual activity: Not on file  Other Topics Concern   Not on file  Social History Narrative   Left handed   Caffeine use: 3-4 cups coffee per week   Lives with mom dad and sister visits she is in college. He takes online college courses   Social Drivers of Health   Tobacco Use: Low Risk (07/28/2024)   Patient History    Smoking Tobacco Use: Never    Smokeless Tobacco Use: Never    Passive Exposure: Not on file  Financial Resource Strain: Not on file  Food Insecurity: Not on file  Transportation Needs: Not on file  Physical Activity: Not on file  Stress: Not on file  Social Connections: Not on file  Intimate Partner Violence: Not on file  Depression (PHQ2-9): Medium Risk (07/28/2024)   Depression (PHQ2-9)    PHQ-2 Score: 5  Alcohol Screen: Not on file  Housing: Not on file  Utilities: Not on file  Health Literacy: Not on file     PHYSICAL EXAM  Vitals:   04/16/24 1304  BP: (!) 148/85  Pulse: 86  SpO2: 98%  Weight: 203 lb (92.1 kg)  Height: 5' 6 (1.676 m)    Body mass index is 32.77 kg/m.   No results found.    General: The patient is  well-developed and well-nourished and in no acute distress  HEENT:  Head is [place]/AT.  Sclera are anicteric.     Skin: Extremities are without rash or  edema.  Mental status: The patient is alert and oriented x 3 at the time of the examination. The patient has apparent normal recent and remote memory, with an apparently normal attention span and concentration ability.   Speech is normal.  Cranial nerves: Extraocular movements are full. Colors are symmetric.   Facial strength and sensation is normal.   No obvious hearing deficits are noted.  Motor:  Muscle bulk is normal.   Tone is normal. Strength is  5 / 5 in all 4 extremities.   Sensory: Sensory testing is intact to pinprick, soft touch and vibration sensation in all 4 extremities.  Coordination: Cerebellar testing reveals good finger-nose-finger and heel-to-shin bilaterally.  Gait and station: Station is normal.   Normal gait and tandem gaitl.  Romberg is negative.   Reflexes: Deep tendon reflexes are symmetric and normal bilaterally.        DIAGNOSTIC DATA (LABS, IMAGING, TESTING) - I reviewed patient records, labs, notes, testing and imaging myself where available.  Lab Results  Component Value Date   WBC 4.3 07/28/2024   HGB 14.9 07/28/2024   HCT 44.5 07/28/2024   MCV 91.8 07/28/2024   PLT 265.0 07/28/2024      Component Value Date/Time   NA 138 07/28/2024 0917   K 4.5 07/28/2024 0917   CL 103 07/28/2024 0917   CO2 28 07/28/2024 0917   GLUCOSE 90 07/28/2024 0917   BUN 11 07/28/2024 0917   CREATININE 0.93 07/28/2024 0917   CREATININE 0.99 11/21/2018 1406   CALCIUM 9.6 07/28/2024 0917   PROT 7.2 07/28/2024 0917   ALBUMIN 5.0 07/28/2024 0917   AST 15 07/28/2024 0917   ALT 11 07/28/2024 0917   ALKPHOS 57 07/28/2024 0917   BILITOT 0.4 07/28/2024 0917   GFRNONAA >60 11/26/2020 0541   Lab Results  Component Value Date   CHOL 213 (H) 07/28/2024  HDL 45.90 07/28/2024   LDLCALC 148 (H) 07/28/2024   TRIG 92.0  07/28/2024   CHOLHDL 5 07/28/2024   Lab Results  Component Value Date   HGBA1C 5.6 07/28/2024   Lab Results  Component Value Date   VITAMINB12 590 07/28/2024   Lab Results  Component Value Date   TSH 1.13 07/28/2024       ASSESSMENT AND PLAN  Multiple sclerosis, relapsing-remitting  Multiple sclerosis - Plan: IgG, IgA, IgM, CBC with Differential/Platelet  High risk medication use - Plan: IgG, IgA, IgM, CBC with Differential/Platelet  Ataxic gait  Mild depression  Vitamin D  deficiency  1.   Continue Ocrevus .  We will check lab work.  Next year we will recheck an MRI of the brain to determine if there is any breakthrough activity and also recheck IgM/IgG and CBC.  If the MRI shows significant progression, consider a different DMT.  2.   Continue Vit D supplements    3.   Stay active and exercise as tolerated.    4.   RTC 6 months or sooner if new or worsening symptoms.       Yuvia Plant A. Vear, MD, [facility] 11/12/2024, 1:36 PM Certified in Neurology, Clinical Neurophysiology, Sleep Medicine and Neuroimaging  [facility] [address] [place], [place] [address] [phone] "

## 2024-04-17 LAB — CBC WITH DIFFERENTIAL/PLATELET
Basophils Absolute: 0 x10E3/uL (ref 0.0–0.2)
Basos: 1 %
EOS (ABSOLUTE): 0.4 x10E3/uL (ref 0.0–0.4)
Eos: 9 %
Hematocrit: 47.4 % (ref 37.5–51.0)
Hemoglobin: 15.1 g/dL (ref 13.0–17.7)
Immature Grans (Abs): 0 x10E3/uL (ref 0.0–0.1)
Immature Granulocytes: 0 %
Lymphocytes Absolute: 1.3 x10E3/uL (ref 0.7–3.1)
Lymphs: 28 %
MCH: 30.1 pg (ref 26.6–33.0)
MCHC: 31.9 g/dL (ref 31.5–35.7)
MCV: 94 fL (ref 79–97)
Monocytes Absolute: 0.5 x10E3/uL (ref 0.1–0.9)
Monocytes: 11 %
Neutrophils Absolute: 2.4 x10E3/uL (ref 1.4–7.0)
Neutrophils: 51 %
Platelets: 253 x10E3/uL (ref 150–450)
RBC: 5.02 x10E6/uL (ref 4.14–5.80)
RDW: 12.6 % (ref 11.6–15.4)
WBC: 4.7 x10E3/uL (ref 3.4–10.8)

## 2024-04-17 LAB — IGG, IGA, IGM
IgA/Immunoglobulin A, Serum: 143 mg/dL (ref 90–386)
IgG (Immunoglobin G), Serum: 1045 mg/dL (ref 603–1613)
IgM (Immunoglobulin M), Srm: 9 mg/dL — ABNORMAL LOW (ref 20–172)

## 2024-04-18 ENCOUNTER — Ambulatory Visit: Payer: Self-pay | Admitting: Neurology

## 2024-07-28 ENCOUNTER — Encounter: Payer: Self-pay | Admitting: Family Medicine

## 2024-07-28 ENCOUNTER — Ambulatory Visit: Payer: BC Managed Care – PPO | Admitting: Family Medicine

## 2024-07-28 VITALS — BP 130/82 | HR 85 | Temp 98.1°F | Ht 66.0 in | Wt 197.8 lb

## 2024-07-28 DIAGNOSIS — Z131 Encounter for screening for diabetes mellitus: Secondary | ICD-10-CM

## 2024-07-28 DIAGNOSIS — E559 Vitamin D deficiency, unspecified: Secondary | ICD-10-CM

## 2024-07-28 DIAGNOSIS — G35D Multiple sclerosis, unspecified: Secondary | ICD-10-CM | POA: Diagnosis not present

## 2024-07-28 DIAGNOSIS — E785 Hyperlipidemia, unspecified: Secondary | ICD-10-CM | POA: Diagnosis not present

## 2024-07-28 DIAGNOSIS — Z0001 Encounter for general adult medical examination with abnormal findings: Secondary | ICD-10-CM

## 2024-07-28 DIAGNOSIS — Z23 Encounter for immunization: Secondary | ICD-10-CM | POA: Diagnosis not present

## 2024-07-28 DIAGNOSIS — L309 Dermatitis, unspecified: Secondary | ICD-10-CM

## 2024-07-28 DIAGNOSIS — Z0000 Encounter for general adult medical examination without abnormal findings: Secondary | ICD-10-CM | POA: Diagnosis not present

## 2024-07-28 LAB — COMPREHENSIVE METABOLIC PANEL WITH GFR
ALT: 11 U/L (ref 0–53)
AST: 15 U/L (ref 0–37)
Albumin: 5 g/dL (ref 3.5–5.2)
Alkaline Phosphatase: 57 U/L (ref 39–117)
BUN: 11 mg/dL (ref 6–23)
CO2: 28 meq/L (ref 19–32)
Calcium: 9.6 mg/dL (ref 8.4–10.5)
Chloride: 103 meq/L (ref 96–112)
Creatinine, Ser: 0.93 mg/dL (ref 0.40–1.50)
GFR: 115.89 mL/min (ref >60.00)
Glucose, Bld: 90 mg/dL (ref 70–99)
Potassium: 4.5 meq/L (ref 3.5–5.1)
Sodium: 138 meq/L (ref 135–145)
Total Bilirubin: 0.4 mg/dL (ref 0.2–1.2)
Total Protein: 7.2 g/dL (ref 6.0–8.3)

## 2024-07-28 LAB — CBC
HCT: 44.5 % (ref 39.0–52.0)
Hemoglobin: 14.9 g/dL (ref 13.0–17.0)
MCHC: 33.6 g/dL (ref 30.0–36.0)
MCV: 91.8 fl (ref 78.0–100.0)
Platelets: 265 K/uL (ref 150.0–400.0)
RBC: 4.84 Mil/uL (ref 4.22–5.81)
RDW: 13.2 % (ref 11.5–15.5)
WBC: 4.3 K/uL (ref 4.0–10.5)

## 2024-07-28 LAB — LIPID PANEL
Cholesterol: 213 mg/dL — ABNORMAL HIGH (ref 0–200)
HDL: 45.9 mg/dL (ref >39.00)
LDL Cholesterol: 148 mg/dL — ABNORMAL HIGH (ref 0–99)
NonHDL: 166.67
Total CHOL/HDL Ratio: 5
Triglycerides: 92 mg/dL (ref 0.0–149.0)
VLDL: 18.4 mg/dL (ref 0.0–40.0)

## 2024-07-28 LAB — TSH: TSH: 1.13 u[IU]/mL (ref 0.35–5.50)

## 2024-07-28 LAB — VITAMIN D 25 HYDROXY (VIT D DEFICIENCY, FRACTURES): VITD: 34.4 ng/mL (ref 30.00–100.00)

## 2024-07-28 LAB — HEMOGLOBIN A1C: Hgb A1c MFr Bld: 5.6 % (ref 4.6–6.5)

## 2024-07-28 LAB — VITAMIN B12: Vitamin B-12: 590 pg/mL (ref 211–911)

## 2024-07-28 MED ORDER — TRIAMCINOLONE ACETONIDE 0.5 % EX OINT: 1.0000 | TOPICAL_OINTMENT | Freq: Two times a day (BID) | CUTANEOUS | 3 refills | Status: AC

## 2024-07-28 NOTE — Progress Notes (Signed)
 Chief Complaint:  Sean Fox. is a 23 y.o. male who presents today for his annual comprehensive physical exam.    Assessment/Plan:  Chronic Problems Addressed Today: Vitamin D  deficiency Check vitamin D .   Multiple sclerosis Follows with neurology.  Stable on Ocrevus .  Eczema Stable on triamcinolone  as needed.  Dyslipidemia Check lipids.  Discussed lifestyle modifications.   Preventative Healthcare: Flu vaccine given today. Check labs.  Discussed importance of diet and exercise and encouraged regular exercise.  Patient Counseling(The following topics were reviewed and/or handout was given):  -Nutrition: Stressed importance of moderation in sodium/caffeine intake, saturated fat and cholesterol, caloric balance, sufficient intake of fresh fruits, vegetables, and fiber.  -Stressed the importance of regular exercise.   -Substance Abuse: Discussed cessation/primary prevention of tobacco, alcohol, or other drug use; driving or other dangerous activities under the influence; availability of treatment for abuse.   -Injury prevention: Discussed safety belts, safety helmets, smoke detector, smoking near bedding or upholstery.   -Sexuality: Discussed sexually transmitted diseases, partner selection, use of condoms, avoidance of unintended pregnancy and contraceptive alternatives.   -Dental health: Discussed importance of regular tooth brushing, flossing, and dental visits.  -Health maintenance and immunizations reviewed. Please refer to Health maintenance section.  Return to care in 1 year for next preventative visit.     Subjective:  HPI:  He has no acute complaints today. Patient is here today for his annual physical.  See assessment / plan for status of chronic conditions.  Discussed the use of AI scribe software for clinical note transcription with the patient, who gave verbal consent to proceed.  History of Present Illness Sean Fox. is a 23 year old male with multiple  sclerosis who presents for an annual physical exam.  He has multiple sclerosis. He experiences fatigue, tiredness, and occasional difficulty with word finding, but these symptoms have been stable without recent flare-ups. He receives ocrelizumab  infusions every eight months.  He has eczema that fluctuates and is currently using triamcinolone  cream as needed. He does not require a refill at this time.  He faces challenges with maintaining physical activity and diet. He is not currently engaging in regular exercise and finds it difficult to incorporate more vegetables into his diet. He drinks water and coffee, trying to reduce sugar intake in his coffee. He is considering walking with his mother as a form of exercise.  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 He finds it challenging to secure a part-time job that suits him.  He reports stable vision but sometimes needs people to repeat themselves, indicating some hearing difficulties.  Lifestyle Diet: None specific.  Exercise: None specific.      07/28/2024    8:21 AM  Depression screen PHQ 2/9  Decreased Interest 0  Down, Depressed, Hopeless 0  PHQ - 2 Score 0  Altered sleeping 1  Tired, decreased energy 3  Change in appetite 0  Feeling bad or failure about yourself  0  Trouble concentrating 1  Moving slowly or fidgety/restless 0  Suicidal thoughts 0  PHQ-9 Score 5  Difficult doing work/chores Somewhat difficult    There are no preventive care reminders to display for this patient.    ROS: Per HPI, otherwise a complete review of systems was negative.   PMH:  The following were reviewed and entered/updated in epic: Past Medical History:  Diagnosis Date   Eczema    Hypertension    Multiple sclerosis  Patient Active Problem List   Diagnosis Date Noted   Eczema 07/19/2022   Dyslipidemia 07/19/2022   High risk medication use 12/12/2020   Mild depression 12/12/2020    Multiple sclerosis 11/23/2020   Allergic rhinitis 04/12/2020   Vitamin D  deficiency 01/02/2019   Past Surgical History:  Procedure Laterality Date   NO PAST SURGERIES      Family History  Problem Relation Age of Onset   Hypertension Mother    Hypertension Father    Lupus Sister    Multiple sclerosis Maternal Uncle    Multiple sclerosis Paternal Cousin    Migraines Neg Hx    Seizures Neg Hx    Autism Neg Hx    ADD / ADHD Neg Hx    Anxiety disorder Neg Hx    Depression Neg Hx    Bipolar disorder Neg Hx    Schizophrenia Neg Hx     Medications- reviewed and updated Current Outpatient Medications  Medication Sig Dispense Refill   cetirizine (ZYRTEC) 10 MG tablet Take 10 mg by mouth daily.  6   cholecalciferol (VITAMIN D3) 25 MCG (1000 UNIT) tablet Take 1,000 Units by mouth daily.     ocrelizumab  300 mg in sodium chloride  0.9 % 250 mL Inject 300 mg into the vein once.     pyridoxine (B-6) 100 MG tablet Take 100 mg by mouth.     Vitamin D , Ergocalciferol , (DRISDOL ) 1.25 MG (50000 UNIT) CAPS capsule Take 50,000 Units by mouth once a week.     triamcinolone  ointment (KENALOG ) 0.5 % Apply 1 Application topically 2 (two) times daily. 60 g 3   No current facility-administered medications for this visit.    Allergies-reviewed and updated Allergies  Allergen Reactions   Gramineae Pollens    Mold Extract [Trichophyton]     Social History   Socioeconomic History   Marital status: Single    Spouse name: Not on file   Number of children: 0   Years of education: 12+   Highest education level: Not on file  Occupational History   Occupation: [employer] [place], attending college  Tobacco Use   Smoking status: Never   Smokeless tobacco: Never  Vaping Use   Vaping status: Never Used  Substance and Sexual Activity   Alcohol use: Never   Drug use: Never   Sexual activity: Not on file  Other Topics Concern   Not on file  Social History Narrative   Left handed   Caffeine use: 3-4  cups coffee per week   Lives with mom dad and sister visits she is in college. He takes Network engineer courses   Social Drivers of Corporate investment banker Strain: Not on file  Food Insecurity: Not on file  Transportation Needs: Not on file  Physical Activity: Not on file  Stress: Not on file  Social Connections: Not on file        Objective:  Physical Exam: BP 130/82   Pulse 85   Temp 98.1 F (36.7 C) (Temporal)   Ht 5' 6 (1.676 m)   Wt 197 lb 12.8 oz (89.7 kg)   SpO2 98%   BMI 31.93 kg/m   Body mass index is 31.93 kg/m. Wt Readings from Last 3 Encounters:  07/28/24 197 lb 12.8 oz (89.7 kg)  04/16/24 203 lb (92.1 kg)  09/30/23 197 lb (89.4 kg)   Gen: NAD, resting comfortably HEENT: TMs normal bilaterally. OP clear. No thyromegaly noted.  CV: RRR with no murmurs appreciated Pulm: NWOB, CTAB  with no crackles, wheezes, or rhonchi GI: Normal bowel sounds present. Soft, Nontender, Nondistended. MSK: no edema, cyanosis, or clubbing noted Skin: warm, dry Neuro: CN2-12 grossly intact. Strength 5/5 in upper and lower extremities. Reflexes symmetric and intact bilaterally.  Psych: Normal affect and thought content     Zanaya Baize M. Kennyth, MD 07/28/2024 8:48 AM

## 2024-07-28 NOTE — Assessment & Plan Note (Signed)
 Check vitamin D.

## 2024-07-28 NOTE — Assessment & Plan Note (Signed)
 Follows with neurology.  Stable on Ocrevus .

## 2024-07-28 NOTE — Patient Instructions (Addendum)
 It was very nice to see you today!  VISIT SUMMARY: You had your annual physical exam today. We discussed your diet, exercise, and overall wellness. You received a flu shot and we ordered some blood tests to check your overall health.  YOUR PLAN: DIET AND EXERCISE: You are facing challenges with maintaining physical activity and incorporating more vegetables into your diet. -Focus on reducing sugar and carbohydrates, increasing fruits and vegetables, and maintaining regular physical activity. -Consider walking with your mother as a form of exercise.  DYSLIPIDEMIA: Your cholesterol levels need to be monitored. -Order a lipid panel to check your cholesterol levels.  VITAMIN D  DEFICIENCY: You have a history of low vitamin D  levels. -Continue taking cholecalciferol supplements. -Check your vitamin D  levels with a blood test.  ECZEMA: Your eczema is well-managed with triamcinolone  ointment. -Continue using triamcinolone  ointment as needed.  GENERAL HEALTH MAINTENANCE: Routine health checks and preventive measures. -Administered flu shot today. -Ordered blood work including A1c, thyroid  function tests, vitamin D , and B12 levels.  Return in about 1 year (around 07/28/2025) for Annual Physical.   Take care, Dr Kennyth  PLEASE NOTE:  If you had any lab tests, please let us  know if you have not heard back within a few days. You may see your results on mychart before we have a chance to review them but we will give you a call once they are reviewed by us .   If we ordered any referrals today, please let us  know if you have not heard from their office within the next week.   If you had any urgent prescriptions sent in today, please check with the pharmacy within an hour of our visit to make sure the prescription was transmitted appropriately.   Please try these tips to maintain a healthy lifestyle:  Eat at least 3 REAL meals and 1-2 snacks per day.  Aim for no more than 5 hours between  eating.  If you eat breakfast, please do so within one hour of getting up.   Each meal should contain half fruits/vegetables, one quarter protein, and one quarter carbs (no bigger than a computer mouse)  Cut down on sweet beverages. This includes juice, soda, and sweet tea.   Drink at least 1 glass of water with each meal and aim for at least 8 glasses per day  Exercise at least 150 minutes every week.    Preventive Care 65-69 Years Old, Male Preventive care refers to lifestyle choices and visits with your health care provider that can promote health and wellness. Preventive care visits are also called wellness exams. What can I expect for my preventive care visit? Counseling During your preventive care visit, your health care provider may ask about your: Medical history, including: Past medical problems. Family medical history. Current health, including: Emotional well-being. Home life and relationship well-being. Sexual activity. Lifestyle, including: Alcohol, nicotine or tobacco, and drug use. Access to firearms. Diet, exercise, and sleep habits. Safety issues such as seatbelt and bike helmet use. Sunscreen use. Work and work Astronomer. Physical exam Your health care provider may check your: Height and weight. These may be used to calculate your BMI (body mass index). BMI is a measurement that tells if you are at a healthy weight. Waist circumference. This measures the distance around your waistline. This measurement also tells if you are at a healthy weight and may help predict your risk of certain diseases, such as type 2 diabetes and high blood pressure. Heart rate and blood pressure. Body  temperature. Skin for abnormal spots. What immunizations do I need?  Vaccines are usually given at various ages, according to a schedule. Your health care provider will recommend vaccines for you based on your age, medical history, and lifestyle or other factors, such as travel or  where you work. What tests do I need? Screening Your health care provider may recommend screening tests for certain conditions. This may include: Lipid and cholesterol levels. Diabetes screening. This is done by checking your blood sugar (glucose) after you have not eaten for a while (fasting). Hepatitis B test. Hepatitis C test. HIV (human immunodeficiency virus) test. STI (sexually transmitted infection) testing, if you are at risk. Talk with your health care provider about your test results, treatment options, and if necessary, the need for more tests. Follow these instructions at home: Eating and drinking  Eat a healthy diet that includes fresh fruits and vegetables, whole grains, lean protein, and low-fat dairy products. Drink enough fluid to keep your urine pale yellow. Take vitamin and mineral supplements as recommended by your health care provider. Do not drink alcohol if your health care provider tells you not to drink. If you drink alcohol: Limit how much you have to 0-2 drinks a day. Know how much alcohol is in your drink. In the [place] one drink equals one 12 oz bottle of beer (355 mL), one 5 oz glass of wine (148 mL), or one 1 oz glass of hard liquor (44 mL). Lifestyle Brush your teeth every morning and night with fluoride toothpaste. Floss one time each day. Exercise for at least 30 minutes 5 or more days each week. Do not use any products that contain nicotine or tobacco. These products include cigarettes, chewing tobacco, and vaping devices, such as e-cigarettes. If you need help quitting, ask your health care provider. Do not use drugs. If you are sexually active, practice safe sex. Use a condom or other form of protection to prevent STIs. Find healthy ways to manage stress, such as: Meditation, yoga, or listening to music. Journaling. Talking to a trusted person. Spending time with friends and family. Minimize exposure to UV radiation to reduce your risk of skin  cancer. Safety Always wear your seat belt while driving or riding in a vehicle. Do not drive: If you have been drinking alcohol. Do not ride with someone who has been drinking. If you have been using any mind-altering substances or drugs. While texting. When you are tired or distracted. Wear a helmet and other protective equipment during sports activities. If you have firearms in your house, make sure you follow all gun safety procedures. Seek help if you have been physically or sexually abused. What's next? Go to your health care provider once a year for an annual wellness visit. Ask your health care provider how often you should have your eyes and teeth checked. Stay up to date on all vaccines. This information is not intended to replace advice given to you by your health care provider. Make sure you discuss any questions you have with your health care provider. Document Revised: 03/29/2021 Document Reviewed: 03/29/2021 Elsevier Patient Education  2024 [employer].

## 2024-07-28 NOTE — Assessment & Plan Note (Signed)
 Check lipids. Discussed lifestyle modifications.

## 2024-07-28 NOTE — Assessment & Plan Note (Signed)
Stable on triamcinolone as needed. ?

## 2024-07-31 ENCOUNTER — Ambulatory Visit: Payer: Self-pay | Admitting: Family Medicine

## 2024-07-31 NOTE — Progress Notes (Signed)
 Cholesterol cholesterol is a little elevated but the rest of his labs are all at goal.  Do not need to start meds but he should work on diet and exercise and we can recheck everything in a year or so.

## 2024-09-28 ENCOUNTER — Other Ambulatory Visit: Payer: Self-pay | Admitting: Family Medicine

## 2024-10-14 ENCOUNTER — Encounter: Payer: Self-pay | Admitting: Neurology

## 2024-11-11 ENCOUNTER — Ambulatory Visit: Admitting: Family Medicine

## 2024-11-12 ENCOUNTER — Telehealth: Payer: Self-pay | Admitting: *Deleted

## 2024-11-12 ENCOUNTER — Encounter: Payer: Self-pay | Admitting: Neurology

## 2024-11-12 NOTE — Telephone Encounter (Signed)
 Received fax from [facility] regarding pt's Ocrevus  which asks for updated MS Dx code.

## 2024-11-12 NOTE — Telephone Encounter (Signed)
"   Relaying to [facility]

## 2024-11-17 ENCOUNTER — Ambulatory Visit: Admitting: Neurology

## 2024-11-17 ENCOUNTER — Encounter: Payer: Self-pay | Admitting: Neurology

## 2024-11-17 VITALS — BP 138/86 | HR 68 | Resp 15 | Ht 66.0 in | Wt 204.0 lb

## 2024-11-17 DIAGNOSIS — E559 Vitamin D deficiency, unspecified: Secondary | ICD-10-CM | POA: Diagnosis not present

## 2024-11-17 DIAGNOSIS — Z79899 Other long term (current) drug therapy: Secondary | ICD-10-CM

## 2024-11-17 DIAGNOSIS — G35A Relapsing-remitting multiple sclerosis: Secondary | ICD-10-CM | POA: Diagnosis not present

## 2024-11-17 DIAGNOSIS — R260 Ataxic gait: Secondary | ICD-10-CM | POA: Diagnosis not present

## 2024-11-17 DIAGNOSIS — F32A Depression, unspecified: Secondary | ICD-10-CM

## 2024-11-17 DIAGNOSIS — R5383 Other fatigue: Secondary | ICD-10-CM | POA: Insufficient documentation

## 2024-11-17 MED ORDER — MODAFINIL 200 MG PO TABS: 200.0000 mg | ORAL_TABLET | Freq: Every day | ORAL | 5 refills | Status: AC

## 2024-11-19 ENCOUNTER — Ambulatory Visit: Payer: Self-pay | Admitting: Neurology

## 2024-11-19 LAB — CBC WITH DIFFERENTIAL/PLATELET
Basophils Absolute: 0 10*3/uL (ref 0.0–0.2)
Basos: 1 %
EOS (ABSOLUTE): 0.2 10*3/uL (ref 0.0–0.4)
Eos: 4 %
Hematocrit: 47.6 % (ref 37.5–51.0)
Hemoglobin: 15.9 g/dL (ref 13.0–17.7)
Immature Grans (Abs): 0 10*3/uL (ref 0.0–0.1)
Immature Granulocytes: 0 %
Lymphocytes Absolute: 1.1 10*3/uL (ref 0.7–3.1)
Lymphs: 27 %
MCH: 30.6 pg (ref 26.6–33.0)
MCHC: 33.4 g/dL (ref 31.5–35.7)
MCV: 92 fL (ref 79–97)
Monocytes Absolute: 0.5 10*3/uL (ref 0.1–0.9)
Monocytes: 11 %
Neutrophils Absolute: 2.5 10*3/uL (ref 1.4–7.0)
Neutrophils: 57 %
Platelets: 294 10*3/uL (ref 150–450)
RBC: 5.2 x10E6/uL (ref 4.14–5.80)
RDW: 12.3 % (ref 11.6–15.4)
WBC: 4.2 10*3/uL (ref 3.4–10.8)

## 2024-11-19 LAB — CD20 B CELLS
% CD19-B Cells: 6.2 (ref 4.6–22.1)
% CD20-B Cells: 6.2 (ref 5.0–22.3)

## 2024-11-19 LAB — IGG, IGA, IGM
IgG (Immunoglobin G), Serum: 1050 mg/dL (ref 603–1613)
IgM (Immunoglobulin M), Srm: 10 mg/dL — ABNORMAL LOW (ref 20–172)
Immunoglobulin A, (IgA) QN, Serum: 159 mg/dL (ref 90–386)

## 2024-11-25 ENCOUNTER — Other Ambulatory Visit

## 2025-07-01 ENCOUNTER — Ambulatory Visit: Admitting: Neurology

## 2025-08-02 ENCOUNTER — Encounter: Admitting: Family Medicine
